# Patient Record
Sex: Female | Born: 1960
Health system: Southern US, Community
[De-identification: ages and names within clinical notes are randomized; demographics above are authoritative.]

## PROBLEM LIST (undated history)

## (undated) DIAGNOSIS — K859 Acute pancreatitis without necrosis or infection, unspecified: Secondary | ICD-10-CM

## (undated) DIAGNOSIS — I639 Cerebral infarction, unspecified: Secondary | ICD-10-CM

## (undated) HISTORY — DX: Acute pancreatitis without necrosis or infection, unspecified: K85.90

## (undated) HISTORY — DX: Cerebral infarction, unspecified: I63.9

---

## 2005-06-12 ENCOUNTER — Ambulatory Visit: Payer: Self-pay | Admitting: Family Medicine

## 2005-10-31 ENCOUNTER — Ambulatory Visit: Payer: Self-pay | Admitting: Family Medicine

## 2005-12-04 ENCOUNTER — Ambulatory Visit: Payer: Self-pay | Admitting: Family Medicine

## 2006-06-18 DIAGNOSIS — I10 Essential (primary) hypertension: Secondary | ICD-10-CM | POA: Insufficient documentation

## 2006-06-18 DIAGNOSIS — E119 Type 2 diabetes mellitus without complications: Secondary | ICD-10-CM | POA: Insufficient documentation

## 2006-09-12 ENCOUNTER — Encounter: Payer: Self-pay | Admitting: Family Medicine

## 2007-03-20 ENCOUNTER — Ambulatory Visit: Payer: Self-pay | Admitting: Family Medicine

## 2007-07-01 ENCOUNTER — Encounter: Payer: Self-pay | Admitting: Family Medicine

## 2007-07-08 ENCOUNTER — Telehealth: Payer: Self-pay | Admitting: Family Medicine

## 2007-11-14 ENCOUNTER — Telehealth (INDEPENDENT_AMBULATORY_CARE_PROVIDER_SITE_OTHER): Payer: Self-pay | Admitting: *Deleted

## 2007-12-15 ENCOUNTER — Ambulatory Visit: Payer: Self-pay | Admitting: Family Medicine

## 2007-12-15 LAB — CONVERTED CEMR LAB
Albumin/Creatinine Ratio, Urine, POC: <30
Blood Glucose, Fasting: 129 mg/dL
Creatinine, urine POC: 300 mg/dL
Hgb A1c MFr Bld: 8.2 %
Microalbumin U total vol: 80 mg/L

## 2008-04-12 ENCOUNTER — Ambulatory Visit: Payer: Self-pay | Admitting: Family Medicine

## 2008-04-12 DIAGNOSIS — N951 Menopausal and female climacteric states: Secondary | ICD-10-CM | POA: Insufficient documentation

## 2008-05-14 ENCOUNTER — Encounter: Payer: Self-pay | Admitting: Family Medicine

## 2008-07-01 ENCOUNTER — Telehealth: Payer: Self-pay | Admitting: Family Medicine

## 2008-09-01 ENCOUNTER — Ambulatory Visit: Payer: Self-pay | Admitting: Family Medicine

## 2008-10-05 ENCOUNTER — Telehealth: Payer: Self-pay | Admitting: Family Medicine

## 2008-12-30 ENCOUNTER — Encounter: Payer: Self-pay | Admitting: Family Medicine

## 2009-02-04 ENCOUNTER — Ambulatory Visit: Payer: Self-pay | Admitting: Family Medicine

## 2009-02-04 LAB — CONVERTED CEMR LAB: Creatinine,U: 200 mg/dL

## 2009-06-09 ENCOUNTER — Encounter: Payer: Self-pay | Admitting: Family Medicine

## 2009-06-13 LAB — CONVERTED CEMR LAB
AST: 20 units/L (ref 0–37)
Albumin: 4.1 g/dL (ref 3.5–5.2)
BUN: 15 mg/dL (ref 6–23)
CO2: 24 meq/L (ref 19–32)
Calcium: 9.2 mg/dL (ref 8.4–10.5)
Chloride: 104 meq/L (ref 96–112)
Folate: >20 ng/mL
HDL: 57 mg/dL (ref >39)
Iron: 48 ug/dL (ref 42–145)
Potassium: 3.5 meq/L (ref 3.5–5.3)
TSH: 1.981 microintl units/mL (ref 0.350–4.500)

## 2009-07-27 ENCOUNTER — Ambulatory Visit: Payer: Self-pay | Admitting: Family Medicine

## 2009-07-27 DIAGNOSIS — F411 Generalized anxiety disorder: Secondary | ICD-10-CM | POA: Insufficient documentation

## 2009-07-27 LAB — CONVERTED CEMR LAB
Bilirubin Urine: NEGATIVE
Nitrite: NEGATIVE
Protein, U semiquant: 30
pH: 6.5

## 2009-08-12 ENCOUNTER — Telehealth: Payer: Self-pay | Admitting: Family Medicine

## 2009-08-15 ENCOUNTER — Ambulatory Visit (HOSPITAL_COMMUNITY): Payer: Self-pay | Admitting: Psychology

## 2009-08-22 ENCOUNTER — Ambulatory Visit (HOSPITAL_COMMUNITY): Payer: Self-pay | Admitting: Psychology

## 2009-08-29 ENCOUNTER — Ambulatory Visit (HOSPITAL_COMMUNITY): Payer: Self-pay | Admitting: Psychology

## 2009-09-16 ENCOUNTER — Telehealth: Payer: Self-pay | Admitting: Family Medicine

## 2010-01-24 ENCOUNTER — Ambulatory Visit: Payer: Self-pay | Admitting: Family

## 2010-01-25 ENCOUNTER — Telehealth: Payer: Self-pay | Admitting: Family

## 2010-02-02 ENCOUNTER — Telehealth: Payer: Self-pay | Admitting: Family

## 2010-02-07 ENCOUNTER — Encounter: Payer: Self-pay | Admitting: Family Medicine

## 2010-06-12 ENCOUNTER — Encounter: Payer: Self-pay | Admitting: Family Medicine

## 2010-08-22 ENCOUNTER — Telehealth: Payer: Self-pay | Admitting: Family Medicine

## 2010-08-29 ENCOUNTER — Ambulatory Visit: Payer: Self-pay | Admitting: Family Medicine

## 2010-09-25 ENCOUNTER — Encounter: Payer: Self-pay | Admitting: Family Medicine

## 2010-10-02 ENCOUNTER — Ambulatory Visit: Admit: 2010-10-02 | Payer: Self-pay | Admitting: Family Medicine

## 2010-10-02 ENCOUNTER — Encounter: Payer: Self-pay | Admitting: Family Medicine

## 2010-10-10 NOTE — Progress Notes (Signed)
Summary: Lab orders  Phone Note Call from Patient Call back at Home Phone [phone]   Caller: Patient Call For: Nani Gasser MD Summary of Call: Pt calls and says needs to have bloodwork done and will go on Monday to [facility]. Need orders of what you want.   Initial call taken by: Kathlene November,  September 16, 2009 3:59 PM  Follow-up for Phone Call        Really just needs an A1C. Can come up here or go to lab.  Follow-up by: Nani Gasser MD,  September 16, 2009 4:08 PM  Additional Follow-up for Phone Call Additional follow up Details #1::        faxed order to lab Additional Follow-up by: Kathlene November,  September 16, 2009 4:14 PM

## 2010-10-10 NOTE — Progress Notes (Signed)
Summary: nutrition referral  Phone Note Other Incoming Call back at [phone] or [phone]   Caller: [name] @ [facility] Summary of Call: They received nutrition referral from [place]. They want to clarify if you want them to do diabetes education or general nutrition counselling.  They only do diabetes education at the [place] location.  Please advise.  [name] CMA  Feb 02, 2010 10:55 AM   Follow-up for Phone Call        diabetes education is fine. Follow-up by: [name],  Feb 02, 2010 10:57 AM  Additional Follow-up for Phone Call Additional follow up Details #1::        Advised Sabrina Dougherty of Sabrina Dougherty's order.  She states she has faxed one of their referrals to the [place] office for [facility] to sign.  I advised her Sabrina Dougherty would get it this afternoon.  [name] CMA  Feb 02, 2010 12:18 PM

## 2010-10-10 NOTE — Consult Note (Signed)
Summary: [facility]  [facility]   Imported By: Lanelle Bal 02/23/2010 11:32:44  _____________________________________________________________________  External Attachment:    Type:   Image     Comment:   External Document

## 2010-10-10 NOTE — Consult Note (Signed)
Summary: [facility]  [facility]   Imported By: Lanelle Bal 06/28/2010 11:19:46  _____________________________________________________________________  External Attachment:    Type:   Image     Comment:   External Document

## 2010-10-10 NOTE — Progress Notes (Signed)
Summary: endocrinology referral  Phone Note Call from Patient   Caller: Patient Summary of Call: Pt [facility] stating you asked her to consider endocrinology. Pt would like to referred to [facility] End[facility].  Initial call taken by: Payton Spark CMA,  Jan 25, 2010 4:11 PM  Follow-up for Phone Call        Left message for patient to return my call.  Will refer to [facility].  Will also instruct patient to increase Lantus to 55 units Subcutaneously daily.   Follow-up by: [name],  Jan 26, 2010 8:40 AM  Additional Follow-up for Phone Call Additional follow up Details #1::        Pt called back and I gave her instructions on insulin and that NP wanted to refer her. Gave her number to [facility] office in case NP had any additional info she wanted to instruct pt on. KJ LPN Additional Follow-up by: Kathlene November,  Jan 26, 2010 9:38 AM     Appended Document: endocrinology referral Spoke with pt. and verified that Malakhai Beitler wanted to make sure she had picked up glucometer. Pt states she dropped script off yesterday and will pick it up soon.

## 2010-10-10 NOTE — Assessment & Plan Note (Signed)
Summary: Sabrina on diabetes- metheney pt. - jr   Vital Signs:  Patient profile:   50 year old female Height:      63 inches Weight:      176.50 pounds BMI:     31.38 Temp:     97.7 degrees F oral Pulse rate:   85 / minute Pulse rhythm:   regular Resp:     16 per minute BP sitting:   168 / 87  (right arm) Cuff size:   large  Vitals Entered By: [name] CMA (Jan 24, 2010 1:49 PM) CC: room 6  Follow up of diabetes. Pt needs rx for glucometer,strips and lancets. Also states she never started Amlodipine due to lack of ins. but will pick up now that she has coverage.  Comments Did not start Chantix due to possible side effects.   Primary Care Provider:  Linford Arnold, C  CC:  room 6  Follow up of diabetes. Pt needs rx for glucometer and strips and lancets. Also states she never started Amlodipine due to lack of ins. but will pick up now that she has coverage. Sabrina Dougherty  History of Present Illness: Ms Sabrina Dougherty is a 50 year old female who presents today for follow up of her diabetes and hypertension.    1) DM2-Patient has not been taking cbg's.  She reports that her mail order would not fill rx of her glucometer and therefore she has not been checking her CBG's.  Reports that she is up to date on eye exam. Has never seen a podiatrist.  Reports that dietary compliance is poor.      2) HTN-  has not been using home blood BP monitor, and did not started norvasc due to lack of insurance.  She now has regained her insurance and plans to initiate Norvasc.   Allergies: 1)  ! Darvon 2)  ! Ace Inhibitors 3)  ! Augmentin  Physical Exam  General:  Overweight AA female awake, alert and in NAD Head:  Normocephalic and atraumatic without obvious abnormalities. No apparent alopecia or balding. Lungs:  Normal respiratory effort, chest expands symmetrically. Lungs are clear to auscultation, no crackles or wheezes. Heart:  Normal rate and regular rhythm. S1 and S2 normal without gallop, murmur, click, rub or  other extra sounds. Extremities:  No LE edema Psych:  Cognition and judgment appear intact. Alert and cooperative with normal attention span and concentration. No apparent delusions, illusions, hallucinations   Impression & Recommendations:  Problem # 1:  DIABETES MELLITUS II, UNCOMPLICATED (ICD-250.00) A1C is 7.7 (improved from last A1C on record 9.6 5/10).  Not yet at goal.  Patient was educated on weight loss, exercise and diabetic diet.  She is agreeable to nutrition referral and a referral for annual foot exam with podiatry.  Will increase Lantus to 55 units Subcutaneously daily.  Patient instructed to monitor CBG's, record and bring this record to her next appointment in 1 month. Her updated medication list for this problem includes:    Lantus Soln (Insulin glargine soln) ..... Inject 50 units daily    Metformin Hcl 1000 Mg Tabs (Metformin hcl) .Sabrina Dougherty... Take 1 tablet by mouth two times a day  Orders: T-Comprehensive Metabolic Panel [phone]) Fingerstick (36416) Hemoglobin A1C ([address]) Podiatry Referral (Podiatry) Nutrition Referral (Nutrition)  Labs Reviewed: Creat: 0.82 (06/09/2009)   Microalbumin: 80 (02/04/2009)  Last Eye Exam: normal (05/14/2008) Reviewed HgBA1c results: 7.7 (01/24/2010)  9.6 (02/04/2009)  Problem # 2:  HYPERTENSION, BENIGN SYSTEMIC (ICD-401.1) Assessment: Deteriorated Patient is not  yet taking amlodipine,  I instructed her to initiate due to uncontrolled HTN and maintain a low sodium diet.    Her updated medication list for this problem includes:    Hydrochlorothiazide 25 Mg Tabs (Hydrochlorothiazide) ..... Once daily by mouth    Metoprolol Tartrate 100 Mg Tabs (Metoprolol tartrate) .Sabrina Dougherty... 2 tabs two times a day po    Amlodipine Besylate 10 Mg Tabs (Amlodipine besylate) .Sabrina Dougherty... Take 1 tablet by mouth once a day  Orders: T-Comprehensive Metabolic Panel ([id number])  BP today: 168/87 Prior BP: 151/89 (07/27/2009)  Prior 10 Yr Risk Heart Disease: 8 %  (07/27/2009)  Labs Reviewed: K+: 3.5 (06/09/2009) Creat: : 0.82 (06/09/2009)   Chol: 142 (06/09/2009)   HDL: 57 (06/09/2009)   LDL: 62 (06/09/2009)   TG: 117 (06/09/2009)  Complete Medication List: 1)  Hydrochlorothiazide 25 Mg Tabs (Hydrochlorothiazide) .... Once daily by mouth 2)  Lantus Soln (Insulin glargine soln) .... Inject 50 units daily 3)  Metoprolol Tartrate 100 Mg Tabs (Metoprolol tartrate) .... 2 tabs two times a day po 4)  Metformin Hcl 1000 Mg Tabs (Metformin hcl) .... Take 1 tablet by mouth two times a day 5)  Amlodipine Besylate 10 Mg Tabs (Amlodipine besylate) .... Take 1 tablet by mouth once a day 6)  Glucometer, Lancets, Strips, Ultra Fine Syringes (100unit)  .... Dx: 250.00 on insulin to test two times a day 7)  Chantix Starting Month Pak 0.5 Mg X 11 & 1 Mg X 42 Tabs (Varenicline tartrate) .... Take as directed. 8)  Omega-3 Cf 1000 Mg Caps (Omega-3 fatty acids) .... Take 1 capsule by mouth once a day  Patient Instructions: 1)  You will called about your referral to Nutrition and Podiatry. 2)  Work hard on diet, exercise and weight loss. 3)  Check sugars twice daily.  Goal sugars are fasting 80-110 and after meals, no greater than 150.   4)  Please follow up in 1 month for a complete physical. Prescriptions: HYDROCHLOROTHIAZIDE 25 MG TABS (HYDROCHLOROTHIAZIDE) once daily by mouth  #30 x 2   Entered and Authorized by:   [name]   Signed by:   [name] on 01/24/2010   Method used:   Electronically to        [facility] (retail)       [address]       [place], [place]  [address]       Ph: [phone]       Fax: [phone]   RxID:   [id number] GLUCOMETER, LANCETS, STRIPS, ULTRA FINE SYRINGES (100UNIT) Dx: 250.00 On insulin to test two times a day  #50month x 2   Entered and Authorized by:   [name]   Signed by:   [name] on 01/24/2010   Method used:   Print then Give to Patient   RxID:    [id number]   Current Allergies (reviewed today): ! DARVON ! ACE INHIBITORS ! AUGMENTIN  Laboratory Results   Blood Tests     HGBA1C: 7.7%   (Normal Range: Non-Diabetic - 3-6%   Control Diabetic - 6-8%)

## 2010-10-12 NOTE — Assessment & Plan Note (Signed)
Summary: CPE w/ pap   Vital Signs:  Patient profile:   50 year old female Height:      63 inches Weight:      181 pounds BMI:     32.18 O2 Sat:      95 % on Room air Pulse rate:   83 / minute BP sitting:   182 / 98  (left arm) Cuff size:   large  Vitals Entered By: Payton Spark CMA (September 05, 2010 10:34 AM)  O2 Flow:  Room air CC: CPE w/ pap   Primary Care Provider:  Linford Arnold, C  CC:  CPE w/ pap.  History of Present Illness: Says she sees an endocrinologist for her diabetes.  She says she gets regular lab work and has checked her urine as well.  She is pretty sure he is checked her cholesterol as well.  She says that her blood pressure is high this morning because she has been up since 1 a.m. with her daughter who has been sick.  Her daughter has special needs.  She says she did take her medication this morning.  She has not been here in every year.  Current Medications (verified): 1)  Hydrochlorothiazide 25 Mg Tabs (Hydrochlorothiazide) .... Once Daily By Mouth 2)  Lantus  Soln (Insulin Glargine Soln) .... Inject 50 Units Daily 3)  Metoprolol Tartrate 100 Mg  Tabs (Metoprolol Tartrate) .... 2 Tabs Two Times A Day Po 4)  Metformin Hcl 1000 Mg  Tabs (Metformin Hcl) .... Take 1 Tablet By Mouth Two Times A Day 5)  Glucometer, Lancets, Strips, Ultra Fine Syringes (100unit) .... Dx: 250.00 On Insulin To Test Two Times A Day 6)  Omega-3 Cf 1000 Mg Caps (Omega-3 Fatty Acids) .... Take 1 Capsule By Mouth Once A Day  Allergies (verified): 1)  ! Darvon 2)  ! Ace Inhibitors 3)  ! Augmentin  Past History:  Past Surgical History: Last updated: 06/18/2006 Cholecystectomy  1996, Tonsillectomy  1966, Tubal ligation  1989  Family History: Last updated: 09/12/2006 Mother - Alcoholism  Sister-DM  Uncle-MI, stroke, DM  Social History: Last updated: 07/27/2009 Married to [employer].  3 children, daughter Val Eagle  is disabled.  Started smoking again.   3 EtOH per week,  no drugs, 1 caffeinated drink per day.  No regular exercise.  Past Medical History: G6 P3 A3 cholecystectomy 1996 Tubal  ligation 1989 Tonsillectomy 1966 Cataracts OU See endocrinology for her Diabetes.   Physical Exam  General:  Well-developed,well-nourished,in no acute distress; alert,appropriate and cooperative throughout examination Head:  Normocephalic and atraumatic without obvious abnormalities. No apparent alopecia or balding. Eyes:  No corneal or conjunctival inflammation noted. EOMI. Perrla. Ears:  External ear exam shows no significant lesions or deformities.  Otoscopic examination reveals clear canals, tympanic membranes are intact bilaterally without bulging, retraction, inflammation or discharge. Hearing is grossly normal bilaterally. Nose:  External nasal examination shows no deformity or inflammation.  Mouth:  Oral mucosa and oropharynx without lesions or exudates.  Teeth in good repair. Neck:  No deformities, masses, or tenderness noted. Chest Wall:  No deformities, masses, or tenderness noted. Breasts:  No mass, nodules, thickening, tenderness, bulging, retraction, inflamation, nipple discharge or skin changes noted.   Lungs:  Normal respiratory effort, chest expands symmetrically. Lungs are clear to auscultation, no crackles or wheezes. Heart:  Normal rate and regular rhythm. S1 and S2 normal without gallop, murmur, click, rub or other extra sounds. Abdomen:  Bowel sounds positive,abdomen soft and non-tender without masses,  organomegaly or hernias noted. Genitalia:  Normal introitus for age, no external lesions, no vaginal discharge, mucosa pink and moist, no vaginal or cervical lesions, no vaginal atrophy, no friaility or hemorrhage, normal uterus size and position, no adnexal masses or tenderness Msk:  No deformity or scoliosis noted of thoracic or lumbar spine.   Pulses:  R and L carotid,radial,dorsalis pedis and posterior tibial pulses are full and equal  bilaterally Extremities:  No clubbing, cyanosis, edema, or deformity noted with normal full range of motion of all joints.   Neurologic:  No cranial nerve deficits noted. Station and gait are normal.  Sensory, motor and coordinative functions appear intact. Skin:  no rashes.   Cervical Nodes:  No lymphadenopathy noted Axillary Nodes:  No palpable lymphadenopathy Psych:  Cognition and judgment appear intact. Alert and cooperative with normal attention span and concentration. No apparent delusions, illusions, hallucinations   Impression & Recommendations:  Problem # 1:  HEALTH MAINTENANCE EXAM (ICD-V70.0)  Exam is normal today except for being obese. Encourage regular exercise and healthy diet She follow with Endocrine for her DM.  She is due for colonsocoyp since she is now 50 yo. She declined flu vac and Tdap today.  she is also due for mammogram and says she's had all of his discomfort in her left breast lately for radiating from her shoulders I do recommend that she get his updated. Orders: T-Mammography Bilateral Screening (03474)  Complete Medication List: 1)  Hydrochlorothiazide 25 Mg Tabs (Hydrochlorothiazide) .... Once daily by mouth 2)  Lantus Soln (Insulin glargine soln) .... Inject 50 units daily 3)  Metoprolol Tartrate 100 Mg Tabs (Metoprolol tartrate) .... 2 tabs two times a day po 4)  Metformin Hcl 1000 Mg Tabs (Metformin hcl) .... Take 1 tablet by mouth two times a day 5)  Glucometer, Lancets, Strips, Ultra Fine Syringes (100unit)  .... Dx: 250.00 on insulin to test two times a day 6)  Omega-3 Cf 1000 Mg Caps (Omega-3 fatty acids) .... Take 1 capsule by mouth once a day  Other Orders: Gastroenterology Referral (GI)  Contraindications/Deferment of Procedures/Staging:    Test/Procedure: FLU VAX    Reason for deferment: patient declined     Test/Procedure: TD vaccine    Reason for deferment: declined   Patient Instructions: 1)  It is important that you exercise  reguarly at least 20 minutes 5 times a week. If you develop chest pain, have severe difficulty breathing, or feel very tired, stop exercising immediately and seek medical attention.  2)  You need to lose weight. Consider a lower calorie diet. 3)  Since you are now 49 you are due for a screening colonoscopy to screen for colon Cancer We will make a referral for you for this.  Prescriptions: METFORMIN HCL 1000 MG  TABS (METFORMIN HCL) Take 1 tablet by mouth two times a day  #180 x 3   Entered and Authorized by:   Nani Gasser MD   Signed by:   Nani Gasser MD on 09/05/2010   Method used:   Printed then faxed to ...       [facility] (retail)       [address]       [place], [place]  [address]       Ph: [phone]       Fax: [phone]   RxID:   [id number] METOPROLOL TARTRATE 100 MG  TABS (METOPROLOL TARTRATE) 2 tabs two times a day po  #180 x 3  Entered and Authorized by:   Nani Gasser MD   Signed by:   Nani Gasser MD on 09/05/2010   Method used:   Printed then faxed to ...       [facility]  [address] (retail)       [address]       [place], [place]  [address]       Ph: [phone]       Fax: [phone]   RxID:   [id number] HYDROCHLOROTHIAZIDE 25 MG TABS (HYDROCHLOROTHIAZIDE) once daily by mouth  #90 x 3   Entered and Authorized by:   Nani Gasser MD   Signed by:   Nani Gasser MD on 09/05/2010   Method used:   Printed then faxed to ...       [facility]  [address] (retail)       [address]       [place], [place]  [address]       Ph: [phone]       Fax: [phone]   RxID:   [id number]    Orders Added: 1)  Gastroenterology Referral [GI] 2)  Est. Patient age 44-64 [53] 3)  T-Mammography Bilateral Screening [[id number]]  Appended Document: CPE w/ pap Call pt: Pap smear normal - to return for  PAP in two years January  3, 20124:40 PM Linford Arnold MD, [facility]   10:44 AM September 13, 2010 [place] CMA, Duncan Dull),  Sue Lush left message on vm with above results

## 2010-10-12 NOTE — Progress Notes (Signed)
  Phone Note Call from Patient   Caller: Patient Call For: Nani Gasser MD Summary of Call: pt called and states she needs a cancer screening and refills of her insulin Initial call taken by: Avon Gully CMA, Duncan Dull),  August 22, 2010 4:35 PM  Follow-up for Phone Call        called pt and advisd her that she needs a CPE per last ov note.scheduled  cpe and will [facility] lantus [facility] [place] Follow-up by: Avon Gully CMA, Duncan Dull),  August 22, 2010 4:35 PM    Prescriptions: LANTUS  SOLN (INSULIN GLARGINE SOLN) inject 50 units daily  #30 x 0   Entered by:   Avon Gully CMA, (AAMA)   Authorized by:   Nani Gasser MD   Signed by:   Avon Gully CMA, (AAMA) on 08/22/2010   Method used:   Printed then faxed to ...       [facility]  [address] (retail)       [address]       [place], [place]  [address]       Ph: [phone]       Fax: [phone]   RxID:   [id number]

## 2010-11-01 NOTE — Letter (Signed)
Summary: [facility]   Imported By: Kassie Mends 10/25/2010 11:37:24  _____________________________________________________________________  External Attachment:    Type:   Image     Comment:   External Document

## 2011-08-14 ENCOUNTER — Encounter: Payer: Self-pay | Admitting: Family Medicine

## 2011-08-14 ENCOUNTER — Ambulatory Visit (INDEPENDENT_AMBULATORY_CARE_PROVIDER_SITE_OTHER): Payer: Self-pay | Admitting: Family Medicine

## 2011-08-14 ENCOUNTER — Other Ambulatory Visit (HOSPITAL_COMMUNITY)
Admission: RE | Admit: 2011-08-14 | Discharge: 2011-08-14 | Disposition: A | Discharge status: Home / Self Care | Payer: Self-pay | Source: Ambulatory Visit | Attending: Family Medicine | Admitting: Family Medicine

## 2011-08-14 VITALS — BP 146/81 | HR 56 | Wt 180.0 lb

## 2011-08-14 DIAGNOSIS — Z01419 Encounter for gynecological examination (general) (routine) without abnormal findings: Secondary | ICD-10-CM

## 2011-08-14 DIAGNOSIS — Z1231 Encounter for screening mammogram for malignant neoplasm of breast: Secondary | ICD-10-CM

## 2011-08-14 DIAGNOSIS — Z23 Encounter for immunization: Secondary | ICD-10-CM

## 2011-08-14 DIAGNOSIS — E119 Type 2 diabetes mellitus without complications: Secondary | ICD-10-CM

## 2011-08-14 DIAGNOSIS — Z1159 Encounter for screening for other viral diseases: Secondary | ICD-10-CM | POA: Insufficient documentation

## 2011-08-14 NOTE — Patient Instructions (Signed)
Start a regular exercise program and make sure you are eating a healthy diet Try to eat 4 servings of dairy a day or take a calcium supplement (500mg twice a day). Your vaccines are up to date.   

## 2011-08-14 NOTE — Progress Notes (Signed)
  Subjective:     Sabrina Dougherty is a 50 y.o. female and is here for a comprehensive physical exam. The patient reports no problems.  History   Social History  . Marital Status: Married    Spouse Name: N/A    Number of Children: N/A  . Years of Education: N/A   Occupational History  . Not on file.   Social History Main Topics  . Smoking status: Current Everyday Smoker  . Smokeless tobacco: Not on file  . Alcohol Use: 1.5 oz/week    3 drink(s) per week  . Drug Use: No  . Sexually Active: Yes   Other Topics Concern  . Not on file   Social History Narrative  . No narrative on file   Health Maintenance  Topic Date Due  . Tetanus/tdap  09/12/2008  . Mammogram  10/17/2009  . Colonoscopy  10/17/2009  . Influenza Vaccine  06/10/2012  . Pap Smear  08/13/2014    The following portions of the patient's history were reviewed and updated as appropriate: allergies, current medications, past family history, past medical history, past social history, past surgical history and problem list.  Review of Systems A comprehensive review of systems was negative.   Objective:    BP 146/81  Pulse 56  Wt 180 lb (81.647 kg)  LMP 07/10/2011 General appearance: alert, cooperative and appears stated age Head: Normocephalic, without obvious abnormality, atraumatic Eyes: conj clear, EOMi. PEERLA Ears: normal TM's and external ear canals both ears Nose: Nares normal. Septum midline. Mucosa normal. No drainage or sinus tenderness. Throat: lips, mucosa, and tongue normal; teeth and gums normal Neck: no adenopathy, no carotid bruit, no JVD, supple, symmetrical, trachea midline and thyroid not enlarged, symmetric, no tenderness/mass/nodules Back: symmetric, no curvature. ROM normal. No CVA tenderness. Lungs: clear to auscultation bilaterally Breasts: normal appearance, no masses or tenderness Heart: regular rate and rhythm, S1, S2 normal, no murmur, click, rub or gallop Abdomen: soft,  non-tender; bowel sounds normal; no masses,  no organomegaly Pelvic: cervix normal in appearance, external genitalia normal, no adnexal masses or tenderness, no cervical motion tenderness, rectovaginal septum normal, uterus normal size, shape, and consistency and vagina normal without discharge Extremities: extremities normal, atraumatic, no cyanosis or edema Pulses: 2+ and symmetric Skin: Skin color, texture, turgor normal. No rashes or lesions Lymph nodes: Cervical, supraclavicular, and axillary nodes normal. Neurologic: Grossly normal    Assessment:    Healthy female exam.      Plan:     See After Visit Summary for Counseling Recommendations  Start a regular exercise program and make sure you are eating a healthy diet Try to eat 4 servings of dairy a day or take a calcium supplement (500mg  twice a day). Your vaccines are up to date.  Flu vaccine given.  She wants to hold on on tetanus vaccine today since getting flu shot Will sched for mamo.

## 2011-08-16 ENCOUNTER — Ambulatory Visit: Payer: Self-pay

## 2011-09-25 ENCOUNTER — Encounter: Payer: Self-pay | Admitting: Physician Assistant

## 2011-09-25 ENCOUNTER — Ambulatory Visit (INDEPENDENT_AMBULATORY_CARE_PROVIDER_SITE_OTHER): Payer: Self-pay | Admitting: Physician Assistant

## 2011-09-25 VITALS — BP 158/88 | HR 71 | Wt 178.0 lb

## 2011-09-25 DIAGNOSIS — L0100 Impetigo, unspecified: Secondary | ICD-10-CM

## 2011-09-25 DIAGNOSIS — J302 Other seasonal allergic rhinitis: Secondary | ICD-10-CM

## 2011-09-25 DIAGNOSIS — I10 Essential (primary) hypertension: Secondary | ICD-10-CM

## 2011-09-25 DIAGNOSIS — J309 Allergic rhinitis, unspecified: Secondary | ICD-10-CM

## 2011-09-25 MED ORDER — MUPIROCIN 2 % EX OINT: TOPICAL_OINTMENT | Freq: Three times a day (TID) | CUTANEOUS | Status: AC

## 2011-09-25 NOTE — Progress Notes (Signed)
  Subjective:    Patient ID: Sabrina Dougherty, female    DOB: 08-Oct-1960, 51 y.o.   MRN: [id number]  HPI Patient noticed what looked like a bug bite about 1 1/2 weeks ago. She noticed clear drainage and that it itched a lot. It gradually started to get bigger with more discharge. She used lots of home remedies that she didn't know exactly what was in them along with hydrogen peroxide, bleach, and OTC ointment. They caused the bump to scab over but it has not gone away. She reports it still itches and there is some pain. She denies fever, chills, nausea, or vomiting.   Patient does not check her blood pressures at home; however, today she has not taken her blood pressure medicine yesterday and did not take today's dose until 30 minutes ago. She denies headache or feeling bad.   She feels "stopped up". Her ears have been popping lately. She denies sinus pressure or sore throat. She has not had any SOB or wheezing.   Review of Systems     Objective:   Physical Exam  Constitutional: She is oriented to person, place, and time. She appears well-developed and well-nourished.  HENT:  Head: Normocephalic and atraumatic.  Right Ear: External ear normal.  Left Ear: External ear normal.  Mouth/Throat: Oropharynx is clear and moist. No oropharyngeal exudate.       No maxillary tenderness. Bilateral edematous turbinates with clear rhinorrhea.   Eyes: Right eye exhibits no discharge. Left eye exhibits no discharge.  Neck: Normal range of motion. Neck supple.  Cardiovascular: Normal rate, regular rhythm and normal heart sounds.   Pulmonary/Chest: Effort normal and breath sounds normal.  Lymphadenopathy:    She has no cervical adenopathy.  Neurological: She is alert and oriented to person, place, and time.  Skin:       1 3/4 cm by 2cm plaque that is scabbed over with some honey crusting lining the outside.   Psychiatric: She has a normal mood and affect. Her behavior is normal.            Assessment & Plan:  Impetigo- Bactroban prescribed. Symptomatic care given in handout form. Instructed patient to stop all other OTC care.   Allergic rhinitis- Samples given of Veramyst. Symptomatic care given.  Hypertension-Recheck blood pressure after I took history and it did decrease. Instructed patient to work at remembering to take bp meds everyday for 2 weeks and then schedule a nurse visit for a blood pressure recheck.

## 2011-09-25 NOTE — Patient Instructions (Addendum)
Start antibiotic cream. Soak daily in hibacleans with warm water to get the scab off. Stop using any OTC oinments. Start Veramyst for allergies. Make an appointment for nurse visit to check blood pressure.  Impetigo Impetigo is an infection of the skin, most common in babies and children.  CAUSES  It is caused by staphylococcal or streptococcal germs (bacteria). Impetigo can start after any damage to the skin. The damage to the skin may be from things like:   Chickenpox.   Scrapes.   Scratches.   Insect bites (common when children scratch the bite).   Cuts.   Nail biting or chewing.  Impetigo is contagious. It can be spread from one person to another. Avoid close skin contact, or sharing towels or clothing. SYMPTOMS  Impetigo usually starts out as small blisters or pustules. Then they turn into tiny yellow-crusted sores (lesions).  There may also be:  Large blisters.   Itching or pain.   Pus.   Swollen lymph glands.  With scratching, irritation, or non-treatment, these small areas may get larger. Scratching can cause the germs to get under the fingernails; then scratching another part of the skin can cause the infection to be spread there. DIAGNOSIS  Diagnosis of impetigo is usually made by a physical exam. A skin culture (test to grow bacteria) may be done to prove the diagnosis or to help decide the best treatment.  TREATMENT  Mild impetigo can be treated with prescription antibiotic cream. Oral antibiotic medicine may be used in more severe cases. Medicines for itching may be used. HOME CARE INSTRUCTIONS   To avoid spreading impetigo to other body areas:   Keep fingernails short and clean.   Avoid scratching.   Cover infected areas if necessary to keep from scratching.   Gently wash the infected areas with antibiotic soap and water.   Soak crusted areas in warm soapy water using antibiotic soap.   Gently rub the areas to remove crusts. Do not scrub.   Wash hands  often to avoid spread this infection.   Keep children with impetigo home from school or daycare until they have used an antibiotic cream for 48 hours (2 days) or oral antibiotic medicine for 24 hours (1 day), and their skin shows significant improvement.   Children may attend school or daycare if they only have a few sores and if the sores can be covered by a bandage or clothing.  SEEK MEDICAL CARE IF:   More blisters or sores show up despite treatment.   Other family members get sores.   Rash is not improving after 48 hours (2 days) of treatment.  SEEK IMMEDIATE MEDICAL CARE IF:   You see spreading redness or swelling of the skin around the sores.   You see red streaks coming from the sores.   Your child develops a fever of 100.4 F (37.2 C) or higher.   Your child develops a sore throat.   Your child is acting ill (lethargic, sick to their stomach).  Document Released: 08/24/2000 Document Revised: 05/09/2011 Document Reviewed: 06/23/2008 [facility] Patient Information 2012 [place], [place].

## 2011-10-05 ENCOUNTER — Telehealth: Payer: Self-pay | Admitting: *Deleted

## 2011-10-05 MED ORDER — AZITHROMYCIN 500 MG PO TABS: 1000.0000 mg | ORAL_TABLET | Freq: Once | ORAL | Status: AC

## 2011-10-05 NOTE — Telephone Encounter (Signed)
Rx sent. Call if not better in one week or if having symptoms.

## 2011-10-05 NOTE — Telephone Encounter (Signed)
Exposed to bacterial infection that husband has- non gonoccial urethritis.- [facility] Annalia wants to be treated as well. [facility] on [address] in [place]. He was given azithromycin and took 2 pills then done.

## 2011-10-09 ENCOUNTER — Telehealth: Payer: Self-pay | Admitting: *Deleted

## 2011-10-09 ENCOUNTER — Other Ambulatory Visit: Payer: Self-pay | Admitting: Family Medicine

## 2011-10-16 ENCOUNTER — Ambulatory Visit
Admission: RE | Admit: 2011-10-16 | Discharge: 2011-10-16 | Disposition: A | Discharge status: Home / Self Care | Payer: 59 | Source: Ambulatory Visit | Attending: Family Medicine | Admitting: Family Medicine

## 2011-10-16 DIAGNOSIS — Z1231 Encounter for screening mammogram for malignant neoplasm of breast: Secondary | ICD-10-CM

## 2011-10-24 ENCOUNTER — Other Ambulatory Visit: Payer: Self-pay | Admitting: Family Medicine

## 2011-10-24 DIAGNOSIS — R928 Other abnormal and inconclusive findings on diagnostic imaging of breast: Secondary | ICD-10-CM

## 2011-11-02 ENCOUNTER — Encounter: Payer: Self-pay | Admitting: *Deleted

## 2011-11-08 ENCOUNTER — Other Ambulatory Visit: Payer: Self-pay | Admitting: *Deleted

## 2011-11-08 MED ORDER — AMBULATORY NON FORMULARY MEDICATION: Status: DC

## 2011-11-09 ENCOUNTER — Ambulatory Visit
Admission: RE | Admit: 2011-11-09 | Discharge: 2011-11-09 | Disposition: A | Discharge status: Home / Self Care | Payer: 59 | Source: Ambulatory Visit | Attending: Family Medicine | Admitting: Family Medicine

## 2011-11-09 DIAGNOSIS — R928 Other abnormal and inconclusive findings on diagnostic imaging of breast: Secondary | ICD-10-CM

## 2012-02-09 DIAGNOSIS — I639 Cerebral infarction, unspecified: Secondary | ICD-10-CM

## 2012-02-09 HISTORY — DX: Cerebral infarction, unspecified: I63.9

## 2012-02-26 ENCOUNTER — Ambulatory Visit (INDEPENDENT_AMBULATORY_CARE_PROVIDER_SITE_OTHER): Payer: 59 | Admitting: Family Medicine

## 2012-02-26 ENCOUNTER — Encounter: Payer: Self-pay | Admitting: Family Medicine

## 2012-02-26 VITALS — BP 191/80 | HR 62 | Wt 178.0 lb

## 2012-02-26 DIAGNOSIS — E119 Type 2 diabetes mellitus without complications: Secondary | ICD-10-CM

## 2012-02-26 DIAGNOSIS — F17200 Nicotine dependence, unspecified, uncomplicated: Secondary | ICD-10-CM

## 2012-02-26 DIAGNOSIS — I639 Cerebral infarction, unspecified: Secondary | ICD-10-CM | POA: Insufficient documentation

## 2012-02-26 DIAGNOSIS — I10 Essential (primary) hypertension: Secondary | ICD-10-CM

## 2012-02-26 DIAGNOSIS — G459 Transient cerebral ischemic attack, unspecified: Secondary | ICD-10-CM

## 2012-02-26 MED ORDER — OLMESARTAN-AMLODIPINE-HCTZ 40-5-25 MG PO TABS: 1.0000 | ORAL_TABLET | Freq: Every day | ORAL | Status: DC

## 2012-02-26 MED ORDER — PRAVASTATIN SODIUM 40 MG PO TABS: 40.0000 mg | ORAL_TABLET | Freq: Every day | ORAL | Status: DC

## 2012-02-26 NOTE — Patient Instructions (Signed)

## 2012-02-26 NOTE — Progress Notes (Addendum)
Subjective:    Patient ID: Sabrina Dougherty, female    DOB: Feb 05, 1961, 51 y.o.   MRN: [id number]  HPI  Here today for hospital followup. Went to [facility] about 2 weeks ago.  Thought maybe had a mini-stroke bc speech was slurred and felt numbness in her right fingers.  Says BP was high. They did start her ASA.  She was supposed to start a new. She doesn't remember what reaction she had to ACEi. She says it was a really long time ago.  She says just doesn't feel right since she has been home.  Last eye exam was in January.  She says the numbness in her right hand has resolved. No residual weakness.      DM- She says she ate what she wanted at the hospital so her sugars went up for awhile.   Lab Results  Component Value Date   HGBA1C 7.7 01/24/2010      Review of Systems     Objective:   Physical Exam  Constitutional: She is oriented to person, place, and time. She appears well-developed and well-nourished.  HENT:  Head: Normocephalic and atraumatic.  Cardiovascular: Normal rate, regular rhythm and normal heart sounds.   Pulmonary/Chest: Effort normal and breath sounds normal.  Neurological: She is alert and oriented to person, place, and time. She displays normal reflexes. No cranial nerve deficit. Coordination normal.       Strength is 5/5 in UE and LE.    Skin: Skin is warm and dry.  Psychiatric: She has a normal mood and affect. Her behavior is normal.          Assessment & Plan:  HTN- Uncontrolled. Will start Tribenzor 40/5/25. Continue metoprolol and d/c hctz.    F/U in 2 weeks. Discussed low salt diet and regular exercise and weight loss.  Stroke  - discussed the importance of control her blood pressure and cholesterol, diabetic control, and ASA. We'll start pravastatin 40 mg daily. I gave her samples of Tribenzor 40/5/25. We are not clear exactly what reaction she had ACE inhibitor. She says it was years ago and doesn't really remember. I told her to stop  immediately if she noticed any rash, shortness of breath, swelling of the tongue or lips. If that happens she is to stop it immediately, call our office and take a Benadryl. If she feels short of breath she is to call 911. We are just not sure if her reaction was mild such as a cough or truly an allergic reaction, as she says she really doesn't remember.  DM- Glucose was 105 today.  Work on getting back into diet and walking for exercise. She has had a hard time losing weight.  Check TSH. [name] recently got a dog to help motivated her to exercise. She has been walking more but says struggling to lose weight  Lab Results  Component Value Date   HGBA1C 7.7 01/24/2010   Addendum: I received notes from her hospital admission to [facility]. Her final diagnosis was slurred speech an acute left temporal infarct. She was admitted on June 1 for slurred speech and numbness in her tongue and the tip of her fingers. She was admitted to rule out CVA. She did have a CT of the head which was negative. MRI revealed acute left temporal infarct. She did have a 2-D echo which showed mild left ventricular hypertrophy with a negative bubble study. She was also noted to have right carotid stenosis 60-75%  of the left and 139% stenosis on the right. Transcranial Dopplers showed increased velocity of the right ACA which is consistent with right carotid stenosis. Her speech improved throughout her hospital admission. Neurology was consulted. She was started on aspirin and a statin daily. Then as noted above she never filled the prescription for the statin. She is supposed to follow with neurology. I will call her and make sure that she has a followup appointment.

## 2012-02-28 ENCOUNTER — Encounter: Payer: Self-pay | Admitting: Family Medicine

## 2012-02-28 DIAGNOSIS — F17200 Nicotine dependence, unspecified, uncomplicated: Secondary | ICD-10-CM | POA: Insufficient documentation

## 2012-03-20 ENCOUNTER — Encounter: Payer: Self-pay | Admitting: Family Medicine

## 2012-03-20 ENCOUNTER — Ambulatory Visit (INDEPENDENT_AMBULATORY_CARE_PROVIDER_SITE_OTHER): Payer: 59 | Admitting: Family Medicine

## 2012-03-20 VITALS — BP 119/79 | HR 70 | Ht 63.0 in | Wt 176.0 lb

## 2012-03-20 DIAGNOSIS — E119 Type 2 diabetes mellitus without complications: Secondary | ICD-10-CM

## 2012-03-20 DIAGNOSIS — I10 Essential (primary) hypertension: Secondary | ICD-10-CM

## 2012-03-20 DIAGNOSIS — E663 Overweight: Secondary | ICD-10-CM

## 2012-03-20 DIAGNOSIS — Z23 Encounter for immunization: Secondary | ICD-10-CM

## 2012-03-20 LAB — POCT GLYCOSYLATED HEMOGLOBIN (HGB A1C): Hemoglobin A1C: 9

## 2012-03-20 MED ORDER — OLMESARTAN-AMLODIPINE-HCTZ 40-5-25 MG PO TABS: 1.0000 | ORAL_TABLET | Freq: Every day | ORAL | Status: DC

## 2012-03-20 MED ORDER — LINAGLIPTIN-METFORMIN HCL 2.5-1000 MG PO TABS: 1.0000 | ORAL_TABLET | Freq: Two times a day (BID) | ORAL | Status: DC

## 2012-03-20 NOTE — Patient Instructions (Addendum)
Continue to work on diet and exercise

## 2012-03-20 NOTE — Progress Notes (Signed)
  Subjective:    Patient ID: Sabrina Dougherty, female    DOB: [birth year], 51 y.o.   MRN: [id number]  HPI HTN - Says doing well on the tribenzor. Says gets sleeps more frequently.  Says not as hungray. Noticed has worse gas. No other side effects. No chest pain or short of breath or dizziness.  DM - Says new glucometer is broken.  No lows, but she is unable to check her meter. No cuts or sores on her feet that are not healing well. She says she's taking her medication regularly.Sabrina Dougherty has been better about her diet the last few weeks.  No regular exercise.  Says can't afford a new machine right now.     Review of Systems     Objective:   Physical Exam  Constitutional: She is oriented to person, place, and time. She appears well-developed and well-nourished.  HENT:  Head: Normocephalic and atraumatic.  Cardiovascular: Normal rate, regular rhythm and normal heart sounds.   Pulmonary/Chest: Effort normal and breath sounds normal.  Neurological: She is alert and oriented to person, place, and time.  Skin: Skin is warm and dry.  Psychiatric: She has a normal mood and affect. Her behavior is normal.          Assessment & Plan:  HTN - Looks fantastic.  On the Tribenzor and metoprolol.  She is feeling well.   Reassured her that this will help reduce her risk of stroke.  Hx of recent TIA. Fatigue may get better in a couple of months.  If not then let me know. Continue to work on low-salt diet and reducing her stress.  DM- A1C is 9.0. Uncontrolled. She plans on joining weight watchers on Monday.  I think this is a fantastic idea. I will also change her to [facility].  Followup in 6 weeks to make sure that she's getting on track and losing weight and that she's not having a low events. Unfortunately she says she will not be able to afford a new glucose machine. We'll see if we have any samples to give her at her followup visit. She will be due for urine microalbumin and foot exam at her next  followup visit.  Overweight - continue to work on weight loss. She plans on joining [employer] on Monday. I strongly encouraged her to a regular exercise which is not doing well. I think she has made some good changes in her diet which is fantastic.

## 2012-03-25 ENCOUNTER — Telehealth: Payer: Self-pay | Admitting: *Deleted

## 2012-03-25 NOTE — Telephone Encounter (Signed)
Pt notified of MD instrucrtions- will try this and let us know if does not work. KG LPN

## 2012-03-25 NOTE — Telephone Encounter (Signed)
Pt calls and states that the BP med is causing her to sleep 11 hours a day and she can not do this, states" there is more to my life than sleep".Wants to know what she can do about this. Also said the "other med" you gave her is expensive as well. Please advise

## 2012-03-25 NOTE — Telephone Encounter (Signed)
The Tribenzor should be $25 a month with the coupon card, which is actually good price because it's actually 3 pills and 1. As far as the metoprolol is concerned, she can try cutting it in half and taking it at bedtime. This may help with the sleepiness. Try this for one week and if she still feels she sleeping too much then please call me and let me know.

## 2012-04-04 ENCOUNTER — Ambulatory Visit (INDEPENDENT_AMBULATORY_CARE_PROVIDER_SITE_OTHER): Payer: 59 | Admitting: Family Medicine

## 2012-04-04 VITALS — BP 122/72 | HR 71 | Wt 176.0 lb

## 2012-04-04 DIAGNOSIS — I10 Essential (primary) hypertension: Secondary | ICD-10-CM

## 2012-04-04 NOTE — Progress Notes (Addendum)
  Subjective:    Patient ID: Sabrina Dougherty, female    DOB: Dec 06, 1960, 51 y.o.   MRN: [id number] BP check. Pt brought forms to be filled out. Mail to pt when complete  Pt denies chest pain, SOB, dizziness, or heart palpitations.  Taking meds as directed w/o problems.  Denies medication side effects.  5 min spent with pt.  HPI    Review of Systems     Objective:   Physical Exam        Assessment & Plan:  HTN- Looks fantastic!!!.  I need to see her back on 06/20/12 for diabetic follow up.   Nani Gasser, MD

## 2012-04-25 ENCOUNTER — Other Ambulatory Visit: Payer: Self-pay | Admitting: *Deleted

## 2012-04-25 MED ORDER — METOPROLOL SUCCINATE ER 100 MG PO TB24: 100.0000 mg | ORAL_TABLET | Freq: Every day | ORAL | Status: DC

## 2012-07-04 ENCOUNTER — Other Ambulatory Visit: Payer: Self-pay | Admitting: Family Medicine

## 2012-07-04 DIAGNOSIS — R921 Mammographic calcification found on diagnostic imaging of breast: Secondary | ICD-10-CM

## 2012-10-15 ENCOUNTER — Encounter: Payer: Self-pay | Admitting: Family Medicine

## 2012-10-15 ENCOUNTER — Ambulatory Visit (INDEPENDENT_AMBULATORY_CARE_PROVIDER_SITE_OTHER): Payer: 59 | Admitting: Family Medicine

## 2012-10-15 VITALS — BP 145/80 | HR 82 | Ht 63.0 in | Wt 167.0 lb

## 2012-10-15 DIAGNOSIS — F17200 Nicotine dependence, unspecified, uncomplicated: Secondary | ICD-10-CM

## 2012-10-15 DIAGNOSIS — Z23 Encounter for immunization: Secondary | ICD-10-CM

## 2012-10-15 DIAGNOSIS — I10 Essential (primary) hypertension: Secondary | ICD-10-CM

## 2012-10-15 DIAGNOSIS — L989 Disorder of the skin and subcutaneous tissue, unspecified: Secondary | ICD-10-CM

## 2012-10-15 DIAGNOSIS — Z720 Tobacco use: Secondary | ICD-10-CM

## 2012-10-15 DIAGNOSIS — R61 Generalized hyperhidrosis: Secondary | ICD-10-CM

## 2012-10-15 DIAGNOSIS — E119 Type 2 diabetes mellitus without complications: Secondary | ICD-10-CM

## 2012-10-15 LAB — POCT UA - MICROALBUMIN

## 2012-10-15 MED ORDER — PRAVASTATIN SODIUM 40 MG PO TABS: 40.0000 mg | ORAL_TABLET | Freq: Every day | ORAL | Status: DC

## 2012-10-15 MED ORDER — MUPIROCIN 2 % EX OINT: TOPICAL_OINTMENT | Freq: Two times a day (BID) | CUTANEOUS | Status: DC

## 2012-10-15 MED ORDER — METOPROLOL SUCCINATE ER 50 MG PO TB24: 50.0000 mg | ORAL_TABLET | Freq: Every day | ORAL | Status: DC

## 2012-10-15 MED ORDER — METFORMIN HCL 1000 MG PO TABS: 1000.0000 mg | ORAL_TABLET | Freq: Two times a day (BID) | ORAL | Status: DC

## 2012-10-15 MED ORDER — OLMESARTAN-AMLODIPINE-HCTZ 40-5-25 MG PO TABS: 1.0000 | ORAL_TABLET | Freq: Every day | ORAL | Status: DC

## 2012-10-15 MED ORDER — INSULIN GLARGINE 100 UNIT/ML ~~LOC~~ SOLN: 50.0000 [IU] | Freq: Every day | SUBCUTANEOUS | Status: DC

## 2012-10-15 NOTE — Patient Instructions (Addendum)
Call if spot behind your eye is not improving.  Smoking Cessation Quitting smoking is important to your health and has many advantages. However, it is not always easy to quit since nicotine is a very addictive drug. Often times, people try 3 times or more before being able to quit. This document explains the best ways for you to prepare to quit smoking. Quitting takes hard work and a lot of effort, but you can do it. ADVANTAGES OF QUITTING SMOKING  You will live longer, feel better, and live better.   Your body will feel the impact of quitting smoking almost immediately.   Within 20 minutes, blood pressure decreases. Your pulse returns to its normal level.   After 8 hours, carbon monoxide levels in the blood return to normal. Your oxygen level increases.   After 24 hours, the chance of having a heart attack starts to decrease. Your breath, hair, and body stop smelling like smoke.   After 48 hours, damaged nerve endings begin to recover. Your sense of taste and smell improve.   After 72 hours, the body is virtually free of nicotine. Your bronchial tubes relax and breathing becomes easier.   After 2 to 12 weeks, lungs can hold more air. Exercise becomes easier and circulation improves.   The risk of having a heart attack, stroke, cancer, or lung disease is greatly reduced.   After 1 year, the risk of coronary heart disease is cut in half.   After 5 years, the risk of stroke falls to the same as a nonsmoker.   After 10 years, the risk of lung cancer is cut in half and the risk of other cancers decreases significantly.   After 15 years, the risk of coronary heart disease drops, usually to the level of a nonsmoker.   If you are pregnant, quitting smoking will improve your chances of having a healthy baby.   The people you live with, especially any children, will be healthier.   You will have extra money to spend on things other than cigarettes.  QUESTIONS TO THINK ABOUT BEFORE  ATTEMPTING TO QUIT You may want to talk about your answers with your caregiver.  Why do you want to quit?   If you tried to quit in the past, what helped and what did not?   What will be the most difficult situations for you after you quit? How will you plan to handle them?   Who can help you through the tough times? Your family? Friends? A caregiver?   What pleasures do you get from smoking? What ways can you still get pleasure if you quit?  Here are some questions to ask your caregiver:  How can you help me to be successful at quitting?   What medicine do you think would be best for me and how should I take it?   What should I do if I need more help?   What is smoking withdrawal like? How can I get information on withdrawal?  GET READY  Set a quit date.   Change your environment by getting rid of all cigarettes, ashtrays, matches, and lighters in your home, car, or work. Do not let people smoke in your home.   Review your past attempts to quit. Think about what worked and what did not.  GET SUPPORT AND ENCOURAGEMENT You have a better chance of being successful if you have help. You can get support in many ways.  Tell your family, friends, and co-workers that you are  going to quit and need their support. Ask them not to smoke around you.   Get individual, group, or telephone counseling and support. Programs are available at [employer] and health centers. Call your local health department for information about programs in your area.   Spiritual beliefs and practices may help some smokers quit.   Download a "quit meter" on your computer to keep track of quit statistics, such as how long you have gone without smoking, cigarettes not smoked, and money saved.   Get a self-help book about quitting smoking and staying off of tobacco.  LEARN NEW SKILLS AND BEHAVIORS  Distract yourself from urges to smoke. Talk to someone, go for a walk, or occupy your time with a task.    Change your normal routine. Take a different route to work. Drink tea instead of coffee. Eat breakfast in a different place.   Reduce your stress. Take a hot bath, exercise, or read a book.   Plan something enjoyable to do every day. Reward yourself for not smoking.   Explore interactive web-based programs that specialize in helping you quit.  GET MEDICINE AND USE IT CORRECTLY Medicines can help you stop smoking and decrease the urge to smoke. Combining medicine with the above behavioral methods and support can greatly increase your chances of successfully quitting smoking.  Nicotine replacement therapy helps deliver nicotine to your body without the negative effects and risks of smoking. Nicotine replacement therapy includes nicotine gum, lozenges, inhalers, nasal sprays, and skin patches. Some may be available over-the-counter and others require a prescription.   Antidepressant medicine helps people abstain from smoking, but how this works is unknown. This medicine is available by prescription.   Nicotinic receptor partial agonist medicine simulates the effect of nicotine in your brain. This medicine is available by prescription.  Ask your caregiver for advice about which medicines to use and how to use them based on your health history. Your caregiver will tell you what side effects to look out for if you choose to be on a medicine or therapy. Carefully read the information on the package. Do not use any other product containing nicotine while using a nicotine replacement product.   RELAPSE OR DIFFICULT SITUATIONS Most relapses occur within the first 3 months after quitting. Do not be discouraged if you start smoking again. Remember, most people try several times before finally quitting. You may have symptoms of withdrawal because your body is used to nicotine. You may crave cigarettes, be irritable, feel very hungry, cough often, get headaches, or have difficulty concentrating. The  withdrawal symptoms are only temporary. They are strongest when you first quit, but they will go away within 10 14 days. To reduce the chances of relapse, try to:  Avoid drinking alcohol. Drinking lowers your chances of successfully quitting.   Reduce the amount of caffeine you consume. Once you quit smoking, the amount of caffeine in your body increases and can give you symptoms, such as a rapid heartbeat, sweating, and anxiety.   Avoid smokers because they can make you want to smoke.   Do not let weight gain distract you. Many smokers will gain weight when they quit, usually less than 10 pounds. Eat a healthy diet and stay active. You can always lose the weight gained after you quit.   Find ways to improve your mood other than smoking.  FOR MORE INFORMATION   www.smokefree.gov   Document Released: 08/21/2001 Document Revised: 02/26/2012 Document Reviewed: 12/06/2011 [facility] Patient Information 2013 [place].

## 2012-10-15 NOTE — Progress Notes (Signed)
  Subjective:    Patient ID: Sabrina Dougherty, female    DOB: 21-Apr-1961, 52 y.o.   MRN: [id number]  HPI HTN- Didn't take her meds today.   Pt denies chest pain, SOB, dizziness, or heart palpitations.  Taking meds as directed w/o problems.  Denies medication side effects.    DM- has been checking her sugars.  Has been working out some at [employer]  No hypoglycemic events.  No cuts or sorest that aren't healing well.  Most sugars are running under 120.  Has been out of her statin for a couple of months.   Having nightsweats several months ago. Periods are very irregular. Last one was about 3 months.  No daytime hotflashes.  Not sleeping as well.  No mood changes.  More vaginal dryness.    Lesion behind right ear-she noticed it about a week ago. She says it's a little bit tender and itchy ears she did not have any pain before the bump appeared. She's not been treating it with anything. It has not been bleeding or oozing. She wonders if it could be shingles.  Review of Systems     Objective:   Physical Exam  Constitutional: She is oriented to person, place, and time. She appears well-developed and well-nourished.  HENT:  Head: Normocephalic and atraumatic.  Cardiovascular: Normal rate, regular rhythm and normal heart sounds.   Pulmonary/Chest: Effort normal and breath sounds normal.  Neurological: She is alert and oriented to person, place, and time.  Skin: Skin is warm and dry.       Approximately half a centimeter round scab behind the right ear, along the hairline. No active drainage, edema or surrounding erythema.  Psychiatric: She has a normal mood and affect. Her behavior is normal.          Assessment & Plan:  HTN  - uncontrolled today but didn't take meds this AM.  F/U in 3 months.  Continue work on low-salt diet.  DM- Well controlled.  A1C is 6.4.  Continue with diet and exercise. I'm very part of her and she is on fantastic with her weight loss and trying to work out  regularly. On ASA, ARB and statin. Continue current regimen. Pneumonia vaccine and flu vaccines updated today. She has her eye exam scheduled for Monday.  Nightsweats - Will check CBC and thyroid ot r/u abnormalities.  Explained this certainly could be hormonal. Discussed risks of HRT.  She is not interested at this time.  She reports her mammogram is up-to-date. She's actually due this month. She's had received a letter from them.  Lesion behind the right ear-gave reassurance. Right now it is a scab. We'll treat with Bactroban ointment. And not improving in the next week then recommend shave biopsy for further evaluation. I reassured her that does not look like shingles.  Needs Pneumonia vaccine and flu shot.     tobacco abuse-congratulated her on quitting. She says she quit about 9 months ago.

## 2013-02-03 ENCOUNTER — Ambulatory Visit: Payer: 59 | Admitting: Family Medicine

## 2013-02-06 ENCOUNTER — Encounter: Payer: Self-pay | Admitting: Family Medicine

## 2013-02-06 ENCOUNTER — Ambulatory Visit (INDEPENDENT_AMBULATORY_CARE_PROVIDER_SITE_OTHER): Payer: 59 | Admitting: Family Medicine

## 2013-02-06 VITALS — BP 155/76 | HR 82 | Ht 63.0 in | Wt 166.0 lb

## 2013-02-06 DIAGNOSIS — F17200 Nicotine dependence, unspecified, uncomplicated: Secondary | ICD-10-CM

## 2013-02-06 DIAGNOSIS — B081 Molluscum contagiosum: Secondary | ICD-10-CM

## 2013-02-06 DIAGNOSIS — Z23 Encounter for immunization: Secondary | ICD-10-CM

## 2013-02-06 DIAGNOSIS — E119 Type 2 diabetes mellitus without complications: Secondary | ICD-10-CM

## 2013-02-06 DIAGNOSIS — I10 Essential (primary) hypertension: Secondary | ICD-10-CM

## 2013-02-06 DIAGNOSIS — Z720 Tobacco use: Secondary | ICD-10-CM

## 2013-02-06 MED ORDER — IMIQUIMOD 5 % EX CREA: TOPICAL_CREAM | CUTANEOUS | Status: DC

## 2013-02-06 NOTE — Progress Notes (Signed)
Subjective:    Patient ID: Sabrina Dougherty, female    DOB: 1961-01-23, 52 y.o.   MRN: [id number]  HPI DM - Says sugar bottomed out yesterday and called EMS so didn't take her BP pills today. Says he ate normally.  Hasn't been exercising as much as usuall. But plans to start again. On 50 units of lantus. Had eye appt scheduled in August.    HTN -  Pt denies chest pain, SOB, dizziness, or heart palpitations.  Taking meds as directed w/o problems.  Denies medication side effects.   Lesion behind the left ear - she has a lesion behind her left ear and has been there for quite some time. She wanted me to look at it today. It otherwise not bothersome or painful  Review of Systems     BP 155/76  Pulse 82  Ht 5\' 3"  (1.6 m)  Wt 166 lb (75.297 kg)  BMI 29.41 kg/m2    Allergies  Allergen Reactions  . Ace Inhibitors   . Amoxicillin-Pot Clavulanate     REACTION: skin peeling  . Propoxyphene Hcl     Past Medical History  Diagnosis Date  . Diabetes mellitus   . Stroke 1990s    left middle cerebral artery stroke  . Stroke 02/2012    left temporal infarct    History reviewed. No pertinent past surgical history.  History   Social History  . Marital Status: Married    Spouse Name: N/A    Number of Children: N/A  . Years of Education: N/A   Occupational History  . Not on file.   Social History Main Topics  . Smoking status: Current Every Day Smoker -- 0.10 packs/day    Types: Cigarettes    Last Attempt to Quit: 01/13/2012  . Smokeless tobacco: Not on file  . Alcohol Use: 1.5 oz/week    3 drink(s) per week  . Drug Use: No  . Sexually Active: Yes   Other Topics Concern  . Not on file   Social History Narrative  . No narrative on file    Family History  Problem Relation Age of Onset  . Alcohol abuse Mother   . Diabetes Sister     Outpatient Encounter Prescriptions as of 02/06/2013  Medication Sig Dispense Refill  . AMBULATORY NON FORMULARY MEDICATION  Medication Name: Freestyle Promise Insulin X strips; glucometer checks BID  50 each  2  . aspirin 81 MG tablet Take 81 mg by mouth daily.      . insulin glargine (LANTUS) 100 UNIT/ML injection Inject 50 Units into the skin at bedtime.  15 pen  1  . metFORMIN (GLUCOPHAGE) 1000 MG tablet Take 1 tablet (1,000 mg total) by mouth 2 (two) times daily with a meal.  180 tablet  1  . metoprolol succinate (TOPROL-XL) 50 MG 24 hr tablet Take 1 tablet (50 mg total) by mouth daily.  90 tablet  1  . mupirocin ointment (BACTROBAN) 2 % Apply topically 2 (two) times daily.  15 g  0  . Olmesartan-Amlodipine-HCTZ (TRIBENZOR) 40-5-25 MG TABS Take 1 tablet by mouth daily.  90 tablet  1  . OMEGA 3 1000 MG CAPS Take by mouth.        . pravastatin (PRAVACHOL) 40 MG tablet Take 1 tablet (40 mg total) by mouth at bedtime.  90 tablet  3  . imiquimod (ALDARA) 5 % cream Apply to affected area three times weekly  12 each  2  . [DISCONTINUED] metoprolol succinate (  TOPROL-XL) 100 MG 24 hr tablet Take 50 mg by mouth daily.       No facility-administered encounter medications on file as of 02/06/2013.        Objective:   Physical Exam  Constitutional: She is oriented to person, place, and time. She appears well-developed and well-nourished.  HENT:  Head: Normocephalic and atraumatic.  Cardiovascular: Normal rate, regular rhythm and normal heart sounds.   Pulmonary/Chest: Effort normal and breath sounds normal.  Neurological: She is alert and oriented to person, place, and time.  Skin: Skin is warm and dry.  She has what looks like a erythematous/pink papule behind the left ear that is umbilicated.  Psychiatric: She has a normal mood and affect. Her behavior is normal.          Assessment & Plan:  DM- Decrease Lantus ot 45 untis bc of hypoglycemic events. On statin, ASA, and ACE. Has eye appt scheduled.  Once she starts working out regularly been decreased down to 40 units. She still experiences hypoglycemic events  and please call me. Her diabetes is well-controlled her looks absolutely fantastic today. Pneumonia vaccine given and updated. She has a scheduled eye appointment in August.  HTN - Uncontrolled today but skipped her pills bc called EMS yesterday.   tob abuse - started smoking again. Has been smoking one a day. Encourage cessation. We discussed other ways to help relieve her stress.  Molluscum contagiosum-will treat with topical Aldara. Call if not improving over the next month. Also consider biopsy if not resolving.

## 2013-02-06 NOTE — Patient Instructions (Signed)
Decrease the lantus 45 units.  Once exercising regularly then decrease to 40 units.

## 2013-02-09 NOTE — Addendum Note (Signed)
Addended by: Deno Etienne on: 02/09/2013 04:20 PM   Modules accepted: Orders

## 2013-02-12 ENCOUNTER — Telehealth: Payer: Self-pay | Admitting: *Deleted

## 2013-02-12 NOTE — Telephone Encounter (Signed)
Pt calls back to let me know that after she ate a sundae her sugar only went up to 44. She said that her husband was going to the store to pick up 3 different kinds of juices for her.  I advised her to drink a big glass when he gets home & to recheck it again 25-30 after.  I also advised her to keep an eye on her sugars today to make sure they are trending up & to call us if they stay low or drop significantly again.

## 2013-02-12 NOTE — Telephone Encounter (Signed)
Pt calls & states that her blood sugar this morning is 31.  I advised her to eat & drink some sort of juice with some sugar in it. She has not taken her insulin yet this morning.  Is she still taking too much insulin? Please advise

## 2013-02-13 ENCOUNTER — Telehealth: Payer: Self-pay | Admitting: *Deleted

## 2013-02-13 NOTE — Telephone Encounter (Signed)
Left detailed message on home phone to hold Lantus and use metformin. Cell number in chart is the wrong number. Barry Dienes, LPN

## 2013-02-13 NOTE — Telephone Encounter (Signed)
Hold her lantus for now and just take the metformin.

## 2013-02-13 NOTE — Telephone Encounter (Signed)
Did not take yesterday due to BS being low. Last night sugar was 213 with no meds and this morning it is 101. Wants to know what she needs to do. Barry Dienes, LPN

## 2013-02-17 ENCOUNTER — Telehealth: Payer: Self-pay | Admitting: *Deleted

## 2013-02-17 NOTE — Telephone Encounter (Signed)
Have her restart with 10 units of Lantus at bedtime and make sure taking metformin and see how her sugars does on that regimen.

## 2013-02-17 NOTE — Telephone Encounter (Signed)
Pt calls & states that her blood sugars were 121 fasting on sat, 220 at lunch sat, 230 at dinner sat; 180 fasting on sun, 222 at lunch on Sunday, & 234 at dinner on sun.  Monday was 215 fasting, 216 at lunch, 255 at dinner.  These are all with no insulin but with metformin. This morning's fasting sugar was 318, lunch was 306; still not taking insulin. She states that last Friday night she did 30 units of insulin (did not take metformin that day) and the results were 121 the next morning.

## 2013-02-17 NOTE — Telephone Encounter (Signed)
Opened in error

## 2013-02-18 ENCOUNTER — Other Ambulatory Visit: Payer: Self-pay | Admitting: *Deleted

## 2013-02-18 MED ORDER — METFORMIN HCL 1000 MG PO TABS: 1000.0000 mg | ORAL_TABLET | Freq: Two times a day (BID) | ORAL | Status: DC

## 2013-02-18 MED ORDER — INSULIN GLARGINE 100 UNIT/ML ~~LOC~~ SOLN: 50.0000 [IU] | Freq: Every day | SUBCUTANEOUS | Status: DC

## 2013-02-18 MED ORDER — OLMESARTAN-AMLODIPINE-HCTZ 40-5-25 MG PO TABS: 1.0000 | ORAL_TABLET | Freq: Every day | ORAL | Status: DC

## 2013-02-18 NOTE — Telephone Encounter (Signed)
Pt notified of MD instructions. Kimberly Gordon, LPN  

## 2013-02-18 NOTE — Telephone Encounter (Signed)
Tried to call pt back at home number and VM has not been set up and mobile number is the wrong number. Sabrina Dienes, LPN

## 2013-02-19 ENCOUNTER — Other Ambulatory Visit: Payer: Self-pay | Admitting: *Deleted

## 2013-02-19 MED ORDER — METFORMIN HCL 1000 MG PO TABS: 1000.0000 mg | ORAL_TABLET | Freq: Every day | ORAL | Status: DC

## 2013-03-19 ENCOUNTER — Other Ambulatory Visit: Payer: Self-pay

## 2013-03-20 ENCOUNTER — Ambulatory Visit: Payer: 59 | Admitting: Family Medicine

## 2013-08-17 ENCOUNTER — Other Ambulatory Visit: Payer: Self-pay | Admitting: *Deleted

## 2013-08-17 ENCOUNTER — Telehealth: Payer: Self-pay | Admitting: Family Medicine

## 2013-08-17 DIAGNOSIS — I10 Essential (primary) hypertension: Secondary | ICD-10-CM

## 2013-08-17 DIAGNOSIS — I6350 Cerebral infarction due to unspecified occlusion or stenosis of unspecified cerebral artery: Secondary | ICD-10-CM

## 2013-08-17 NOTE — Telephone Encounter (Signed)
Patient called she has an appointment tomorrow and request to have lab order sent downstairs (fasting labs). Patient stated that her daughter had a recent appointment but she did not get a chance to get her labs because she was in so much pain so they want to both get lab work done tomorrow morning. Thanks-

## 2013-08-18 ENCOUNTER — Ambulatory Visit: Payer: 59 | Admitting: Family Medicine

## 2013-08-19 LAB — COMPLETE METABOLIC PANEL WITH GFR
ALT: 23 U/L (ref 0–35)
AST: 15 U/L (ref 0–37)
Albumin: 4.4 g/dL (ref 3.5–5.2)
Calcium: 9.9 mg/dL (ref 8.4–10.5)
Chloride: 101 mEq/L (ref 96–112)
Creat: 0.73 mg/dL (ref 0.50–1.10)
Potassium: 4.3 mEq/L (ref 3.5–5.3)
Sodium: 139 mEq/L (ref 135–145)
Total Protein: 7.7 g/dL (ref 6.0–8.3)

## 2013-08-19 LAB — TSH: TSH: 0.703 u[IU]/mL (ref 0.350–4.500)

## 2013-08-19 LAB — LIPID PANEL: LDL Cholesterol: 111 mg/dL — ABNORMAL HIGH (ref 0–99)

## 2013-08-24 ENCOUNTER — Encounter: Payer: Self-pay | Admitting: Family Medicine

## 2013-08-24 ENCOUNTER — Ambulatory Visit (INDEPENDENT_AMBULATORY_CARE_PROVIDER_SITE_OTHER): Payer: 59 | Admitting: Family Medicine

## 2013-08-24 VITALS — BP 140/76 | HR 76 | Temp 97.0°F | Ht 62.0 in | Wt 150.0 lb

## 2013-08-24 DIAGNOSIS — J069 Acute upper respiratory infection, unspecified: Secondary | ICD-10-CM

## 2013-08-24 DIAGNOSIS — I10 Essential (primary) hypertension: Secondary | ICD-10-CM

## 2013-08-24 DIAGNOSIS — E119 Type 2 diabetes mellitus without complications: Secondary | ICD-10-CM

## 2013-08-24 DIAGNOSIS — Z1231 Encounter for screening mammogram for malignant neoplasm of breast: Secondary | ICD-10-CM

## 2013-08-24 DIAGNOSIS — Z1211 Encounter for screening for malignant neoplasm of colon: Secondary | ICD-10-CM

## 2013-08-24 MED ORDER — METFORMIN HCL 500 MG PO TABS: 500.0000 mg | ORAL_TABLET | Freq: Two times a day (BID) | ORAL | Status: DC

## 2013-08-24 MED ORDER — METFORMIN HCL ER 500 MG PO TB24: 500.0000 mg | ORAL_TABLET | Freq: Every day | ORAL | Status: DC

## 2013-08-24 MED ORDER — LACTULOSE ENCEPHALOPATHY 10 GM/15ML PO SOLN: 30.0000 g | Freq: Two times a day (BID) | ORAL | Status: DC

## 2013-08-24 NOTE — Progress Notes (Signed)
   Subjective:    Patient ID: Sabrina Dougherty, female    DOB: 03/06/1961, 52 y.o.   MRN: [id number]  HPI DM- Was having lows so stopped her medication and has been intermittantly checking her sugars. She has lost 16 lbs. Was working out regularly, until her daughter got placed in a rehabilitation center. And she was visiting her daily and not eating well. She was eating out and eating from a snack machine and not being able to work out. This was for the last month.  4 days of nasal congestion, scratchy throat.  No ear pain or pressure. No fever, chills or sweats.   No OTC medication   HTN- Thinks BP up bc doesn't feel well today.  Pt denies chest pain, SOB, dizziness, or heart palpitations.  Taking meds as directed w/o problems.  Denies medication side effects.     Review of Systems       Objective:   Physical Exam  Constitutional: She is oriented to person, place, and time. She appears well-developed and well-nourished.  HENT:  Head: Normocephalic and atraumatic.  Right Ear: External ear normal.  Left Ear: External ear normal.  Nose: Nose normal.  Mouth/Throat: Oropharynx is clear and moist.  TMs and canals are clear.   Eyes: Conjunctivae and EOM are normal. Pupils are equal, round, and reactive to light.  Neck: Neck supple. No thyromegaly present.  Cardiovascular: Normal rate, regular rhythm and normal heart sounds.   Pulmonary/Chest: Effort normal and breath sounds normal. She has no wheezes.  Lymphadenopathy:    She has no cervical adenopathy.  Neurological: She is alert and oriented to person, place, and time.  Skin: Skin is warm and dry.  Psychiatric: She has a normal mood and affect.          Assessment & Plan:  DM - uncontrolled. Sounds like overall she was making some great progress. She's lost 16 pounds was working out regularly and eating more healthy. But the last month she's been unable to do this. I suspect that that may have caused a significant bump  in her blood sugars. She originally stop the Lantus because of lows. We'll continue to hold Lantus but will restart metformin 500 mg once a day extended release. Followup in 3 months. She reports that her eye exam is up-to-date. We'll call to get a report.  URI - likely viral. Given H.O. Continue symptomatic care. Call if not feeling better by the end of the week or suddenly gets worse.  HTN - borderline elevated today. She has been keeping an eye on it and says it has looked good she is doesn't feel well today. We'll recheck again in March.  Will refer for mammogram and screening colonoscopy. Patient prefers [facility] location as she lives there.

## 2013-09-01 ENCOUNTER — Encounter: Payer: Self-pay | Admitting: Family Medicine

## 2013-09-01 ENCOUNTER — Ambulatory Visit (INDEPENDENT_AMBULATORY_CARE_PROVIDER_SITE_OTHER): Payer: 59 | Admitting: Family Medicine

## 2013-09-01 VITALS — BP 141/84 | HR 78 | Temp 97.9°F | Wt 149.0 lb

## 2013-09-01 DIAGNOSIS — A499 Bacterial infection, unspecified: Secondary | ICD-10-CM

## 2013-09-01 DIAGNOSIS — B9689 Other specified bacterial agents as the cause of diseases classified elsewhere: Secondary | ICD-10-CM

## 2013-09-01 DIAGNOSIS — R05 Cough: Secondary | ICD-10-CM

## 2013-09-01 DIAGNOSIS — J209 Acute bronchitis, unspecified: Secondary | ICD-10-CM

## 2013-09-01 DIAGNOSIS — R059 Cough, unspecified: Secondary | ICD-10-CM

## 2013-09-01 MED ORDER — AZITHROMYCIN 250 MG PO TABS: ORAL_TABLET | ORAL | Status: AC

## 2013-09-01 MED ORDER — HYDROCODONE-HOMATROPINE 5-1.5 MG/5ML PO SYRP: 5.0000 mL | ORAL_SOLUTION | Freq: Four times a day (QID) | ORAL | Status: DC | PRN

## 2013-09-01 NOTE — Progress Notes (Signed)
CC: Sabrina Dougherty is a 52 y.o. female is here for Cough   Subjective: HPI:  Complains of cough that has been present for 2 weeks worsening on a daily basis. Described as moderate to severe in severity present all hours of the day significantly interfere with sleep despite taking NyQuil generic product. Cough is described as nonproductive without blood and sputum. Accompanied by wheezing.  She thinks she has had one episode of posttussive emesis.  She is a smoker. She reports fatigue but denies fevers, chills, chest pain, confusion, nor motor or sensory disturbances   Review Of Systems Outlined In HPI  Past Medical History  Diagnosis Date  . Diabetes mellitus   . Stroke 1990s    left middle cerebral artery stroke  . Stroke 02/2012    left temporal infarct     Family History  Problem Relation Age of Onset  . Alcohol abuse Mother   . Diabetes Sister      History  Substance Use Topics  . Smoking status: Current Every Day Smoker -- 0.10 packs/day    Types: Cigarettes    Last Attempt to Quit: 01/13/2012  . Smokeless tobacco: Not on file  . Alcohol Use: 1.5 oz/week    3 drink(s) per week     Objective: Filed Vitals:   09/01/13 1016  BP: 141/84  Pulse: 78  Temp: 97.9 F (36.6 C)    General: Alert and Oriented, No Acute Distress HEENT: Pupils equal, round, reactive to light. Conjunctivae clear.  External ears unremarkable, canals clear with intact TMs with appropriate landmarks.  Middle ear appears open without effusion. Pink inferior turbinates.  Moist mucous membranes, pharynx without inflammation nor lesions.  Neck supple without palpable lymphadenopathy nor abnormal masses. Lungs: Trace end expiratory rhonchi in all lung fields without wheezing rales or signs of consolidation. Frequently coughing during the exam Cardiac: Regular rate and rhythm. Normal S1/S2.  No murmurs, rubs, nor gallops.   Mental Status: No depression, anxiety, nor agitation. Skin: Warm and  dry.  Assessment & Plan: Evalette was seen today for cough.  Diagnoses and associated orders for this visit:  Cough - HYDROcodone-homatropine (HYCODAN) 5-1.5 MG/5ML syrup; Take 5 mLs by mouth every 6 (six) hours as needed for cough. - azithromycin (ZITHROMAX) 250 MG tablet; Take two tabs at once on day 1, then one tab daily on days 2-5.  Acute bacterial bronchitis - HYDROcodone-homatropine (HYCODAN) 5-1.5 MG/5ML syrup; Take 5 mLs by mouth every 6 (six) hours as needed for cough. - azithromycin (ZITHROMAX) 250 MG tablet; Take two tabs at once on day 1, then one tab daily on days 2-5.    Cough secondary to acute bacterial bronchitis start azithromycin given posttussive emesis and smoking history, as needed Hycodan  Return if symptoms worsen or fail to improve.

## 2013-10-21 ENCOUNTER — Other Ambulatory Visit: Payer: Self-pay | Admitting: *Deleted

## 2013-10-21 MED ORDER — OLMESARTAN-AMLODIPINE-HCTZ 40-5-25 MG PO TABS: 1.0000 | ORAL_TABLET | Freq: Every day | ORAL | Status: DC

## 2013-11-25 ENCOUNTER — Encounter: Payer: Self-pay | Admitting: Physician Assistant

## 2013-11-25 ENCOUNTER — Ambulatory Visit (INDEPENDENT_AMBULATORY_CARE_PROVIDER_SITE_OTHER): Payer: 59

## 2013-11-25 ENCOUNTER — Ambulatory Visit (INDEPENDENT_AMBULATORY_CARE_PROVIDER_SITE_OTHER): Payer: 59 | Admitting: Physician Assistant

## 2013-11-25 VITALS — BP 139/70 | HR 71 | Wt 157.0 lb

## 2013-11-25 DIAGNOSIS — M5030 Other cervical disc degeneration, unspecified cervical region: Secondary | ICD-10-CM

## 2013-11-25 DIAGNOSIS — M542 Cervicalgia: Secondary | ICD-10-CM

## 2013-11-25 MED ORDER — IBUPROFEN 800 MG PO TABS: 800.0000 mg | ORAL_TABLET | Freq: Three times a day (TID) | ORAL | Status: DC | PRN

## 2013-11-25 MED ORDER — CYCLOBENZAPRINE HCL 10 MG PO TABS: 10.0000 mg | ORAL_TABLET | Freq: Three times a day (TID) | ORAL | Status: DC | PRN

## 2013-11-25 MED ORDER — TRAMADOL HCL 50 MG PO TABS
ORAL_TABLET | ORAL | Status: DC
Start: 2013-11-25 — End: 2014-11-02

## 2013-11-25 NOTE — Progress Notes (Signed)
   Subjective:    Patient ID: Sabrina Dougherty, female    DOB: 10/04/1960, 53 y.o.   MRN: [id number]  HPI Pt is a 53 yo woman who presents to the clinic with neck pain since Monday morning. She woke up with the pain and stiffness. They only thing she contribute it to was going to the gym and landing hard on her feet and felt some initial pain up spine but she was fine on Sunday night. No other sleeping habits have changed. Denies any numbness or tingling down arms or legs. No back pain. Denies any fever, chills, sinus pressure, ear pain. Not tried anything. Seems to be stable but not getting any better.  She did get a new pillow which did not help. She has a lot of pain with any movement of her neck.      Review of Systems     Objective:   Physical Exam  Constitutional: She is oriented to person, place, and time. She appears well-developed and well-nourished.  HENT:  Head: Normocephalic and atraumatic.  Cardiovascular: Normal rate, regular rhythm and normal heart sounds.   Musculoskeletal:  ROM of neck limited in all directions. Strength of neck 5/5. Negative spurlings sign. Bilateral arm strength 5/5. Hand grip normal bilaterally. Muscle tightness over trapezius. No pain with palpation over c-spine.   Neurological: She is alert and oriented to person, place, and time.  Psychiatric: She has a normal mood and affect. Her behavior is normal.          Assessment & Plan:  Neck pain- suspect musculoskeletal injury/strain. Pt does not have any radiculopathy symptoms. Will get c-spine xray today. Gave muscle relaxer, ibuprofen, tramadol for break through pain. Exercise given for pt to start 2-3 times a day. Consider alternate ice and heat. Follow up if worsening for not improving in next week or so.

## 2013-11-25 NOTE — Patient Instructions (Signed)
Cervical Strain and Sprain (Whiplash)  with Rehab  Cervical strain and sprains are injuries that commonly occur with "whiplash" injuries. Whiplash occurs when the neck is forcefully whipped backward or forward, such as during a motor vehicle accident. The muscles, ligaments, tendons, discs and nerves of the neck are susceptible to injury when this occurs.  SYMPTOMS   · Pain or stiffness in the front and/or back of neck  · Symptoms may present immediately or up to 24 hours after injury.  · Dizziness, headache, nausea and vomiting.  · Muscle spasm with soreness and stiffness in the neck.  · Tenderness and swelling at the injury site.  CAUSES   Whiplash injuries often occur during contact sports or motor vehicle accidents.   RISK INCREASES WITH:  · Osteoarthritis of the spine.  · Situations that make head or neck accidents or trauma more likely.  · High-risk sports (football, rugby, wrestling, hockey, auto racing, gymnastics, diving, contact karate or boxing).  · Poor strength and flexibility of the neck.  · Previous neck injury.  · Poor tackling technique.  · Improperly fitted or padded equipment.  PREVENTION  · Learn and use proper technique (avoid tackling with the head, spearing and head-butting; use proper falling techniques to avoid landing on the head).  · Warm up and stretch properly before activity.  · Maintain physical fitness:  · Strength, flexibility and endurance.  · Cardiovascular fitness.  · Wear properly fitted and padded protective equipment, such as padded soft collars, for participation in contact sports.  PROGNOSIS   Recovery for cervical strain and sprain injuries is dependent on the extent of the injury. These injuries are usually curable in 1 week to 3 months with appropriate treatment.   RELATED COMPLICATIONS   · Temporary numbness and weakness may occur if the nerve roots are damaged, and this may persist until the nerve has completely healed.  · Chronic pain due to frequent recurrence of  symptoms.  · Prolonged healing, especially if activity is resumed too soon (before complete recovery).  TREATMENT   Treatment initially involves the use of ice and medication to help reduce pain and inflammation. It is also important to perform strengthening and stretching exercises and modify activities that worsen symptoms so the injury does not get worse. These exercises may be performed at home or with a therapist. For patients who experience severe symptoms, a soft padded collar may be recommended to be worn around the neck.   Improving your posture may help reduce symptoms. Posture improvement includes pulling your chin and abdomen in while sitting or standing. If you are sitting, sit in a firm chair with your buttocks against the back of the chair. While sleeping, try replacing your pillow with a small towel rolled to 2 inches in diameter, or use a cervical pillow or soft cervical collar. Poor sleeping positions delay healing.   For patients with nerve root damage, which causes numbness or weakness, the use of a cervical traction apparatus may be recommended. Surgery is rarely necessary for these injuries. However, cervical strain and sprains that are present at birth (congenital) may require surgery.  MEDICATION   · If pain medication is necessary, nonsteroidal anti-inflammatory medications, such as aspirin and ibuprofen, or other minor pain relievers, such as acetaminophen, are often recommended.  · Do not take pain medication for 7 days before surgery.  · Prescription pain relievers may be given if deemed necessary by your caregiver. Use only as directed and only as much as you   need.  HEAT AND COLD:   · Cold treatment (icing) relieves pain and reduces inflammation. Cold treatment should be applied for 10 to 15 minutes every 2 to 3 hours for inflammation and pain and immediately after any activity that aggravates your symptoms. Use ice packs or an ice massage.  · Heat treatment may be used prior to  performing the stretching and strengthening activities prescribed by your caregiver, physical therapist, or athletic trainer. Use a heat pack or a warm soak.  SEEK MEDICAL CARE IF:   · Symptoms get worse or do not improve in 2 weeks despite treatment.  · New, unexplained symptoms develop (drugs used in treatment may produce side effects).  EXERCISES  RANGE OF MOTION (ROM) AND STRETCHING EXERCISES - Cervical Strain and Sprain  These exercises may help you when beginning to rehabilitate your injury. In order to successfully resolve your symptoms, you must improve your posture. These exercises are designed to help reduce the forward-head and rounded-shoulder posture which contributes to this condition. Your symptoms may resolve with or without further involvement from your physician, physical therapist or athletic trainer. While completing these exercises, remember:   · Restoring tissue flexibility helps normal motion to return to the joints. This allows healthier, less painful movement and activity.  · An effective stretch should be held for at least 20 seconds, although you may need to begin with shorter hold times for comfort.  · A stretch should never be painful. You should only feel a gentle lengthening or release in the stretched tissue.  STRETCH- Axial Extensors  · Lie on your back on the floor. You may bend your knees for comfort. Place a rolled up hand towel or dish towel, about 2 inches in diameter, under the part of your head that makes contact with the floor.  · Gently tuck your chin, as if trying to make a "double chin," until you feel a gentle stretch at the base of your head.  · Hold __________ seconds.  Repeat __________ times. Complete this exercise __________ times per day.   STRETECH - Axial Extension   · Stand or sit on a firm surface. Assume a good posture: chest up, shoulders drawn back, abdominal muscles slightly tense, knees unlocked (if standing) and feet hip width apart.  · Slowly retract your  chin so your head slides back and your chin slightly lowers.Continue to look straight ahead.  · You should feel a gentle stretch in the back of your head. Be certain not to feel an aggressive stretch since this can cause headaches later.  · Hold for __________ seconds.  Repeat __________ times. Complete this exercise __________ times per day.  STRETCH  Cervical Side Bend   · Stand or sit on a firm surface. Assume a good posture: chest up, shoulders drawn back, abdominal muscles slightly tense, knees unlocked (if standing) and feet hip width apart.  · Without letting your nose or shoulders move, slowly tip your right / left ear to your shoulder until your feel a gentle stretch in the muscles on the opposite side of your neck.  · Hold __________ seconds.  Repeat __________ times. Complete this exercise __________ times per day.  STRETCH  Cervical Rotators   · Stand or sit on a firm surface. Assume a good posture: chest up, shoulders drawn back, abdominal muscles slightly tense, knees unlocked (if standing) and feet hip width apart.  · Keeping your eyes level with the ground, slowly turn your head until you feel a gentle   stretch along the back and opposite side of your neck.  · Hold __________ seconds.  Repeat __________ times. Complete this exercise __________ times per day.  RANGE OF MOTION - Neck Circles   · Stand or sit on a firm surface. Assume a good posture: chest up, shoulders drawn back, abdominal muscles slightly tense, knees unlocked (if standing) and feet hip width apart.  · Gently roll your head down and around from the back of one shoulder to the back of the other. The motion should never be forced or painful.  · Repeat the motion 10-20 times, or until you feel the neck muscles relax and loosen.  Repeat __________ times. Complete the exercise __________ times per day.  STRENGTHENING EXERCISES - Cervical Strain and Sprain  These exercises may help you when beginning to rehabilitate your injury. They may  resolve your symptoms with or without further involvement from your physician, physical therapist or athletic trainer. While completing these exercises, remember:   · Muscles can gain both the endurance and the strength needed for everyday activities through controlled exercises.  · Complete these exercises as instructed by your physician, physical therapist or athletic trainer. Progress the resistance and repetitions only as guided.  · You may experience muscle soreness or fatigue, but the pain or discomfort you are trying to eliminate should never worsen during these exercises. If this pain does worsen, stop and make certain you are following the directions exactly. If the pain is still present after adjustments, discontinue the exercise until you can discuss the trouble with your clinician.  STRENGTH Cervical Flexors, Isometric  · Face a wall, standing about 6 inches away. Place a small pillow, a ball about 6-8 inches in diameter, or a folded towel between your forehead and the wall.  · Slightly tuck your chin and gently push your forehead into the soft object. Push only with mild to moderate intensity, building up tension gradually. Keep your jaw and forehead relaxed.  · Hold 10 to 20 seconds. Keep your breathing relaxed.  · Release the tension slowly. Relax your neck muscles completely before you start the next repetition.  Repeat __________ times. Complete this exercise __________ times per day.  STRENGTH- Cervical Lateral Flexors, Isometric   · Stand about 6 inches away from a wall. Place a small pillow, a ball about 6-8 inches in diameter, or a folded towel between the side of your head and the wall.  · Slightly tuck your chin and gently tilt your head into the soft object. Push only with mild to moderate intensity, building up tension gradually. Keep your jaw and forehead relaxed.  · Hold 10 to 20 seconds. Keep your breathing relaxed.  · Release the tension slowly. Relax your neck muscles completely before  you start the next repetition.  Repeat __________ times. Complete this exercise __________ times per day.  STRENGTH  Cervical Extensors, Isometric   · Stand about 6 inches away from a wall. Place a small pillow, a ball about 6-8 inches in diameter, or a folded towel between the back of your head and the wall.  · Slightly tuck your chin and gently tilt your head back into the soft object. Push only with mild to moderate intensity, building up tension gradually. Keep your jaw and forehead relaxed.  · Hold 10 to 20 seconds. Keep your breathing relaxed.  · Release the tension slowly. Relax your neck muscles completely before you start the next repetition.  Repeat __________ times. Complete this exercise __________ times per   day.  POSTURE AND BODY MECHANICS CONSIDERATIONS - Cervical Strain and Sprain  Keeping correct posture when sitting, standing or completing your activities will reduce the stress put on different body tissues, allowing injured tissues a chance to heal and limiting painful experiences. The following are general guidelines for improved posture. Your physician or physical therapist will provide you with any instructions specific to your needs. While reading these guidelines, remember:  · The exercises prescribed by your provider will help you have the flexibility and strength to maintain correct postures.  · The correct posture provides the optimal environment for your joints to work. All of your joints have less wear and tear when properly supported by a spine with good posture. This means you will experience a healthier, less painful body.  · Correct posture must be practiced with all of your activities, especially prolonged sitting and standing. Correct posture is as important when doing repetitive low-stress activities (typing) as it is when doing a single heavy-load activity (lifting).  PROLONGED STANDING WHILE SLIGHTLY LEANING FORWARD  When completing a task that requires you to lean forward while  standing in one place for a long time, place either foot up on a stationary 2-4 inch high object to help maintain the best posture. When both feet are on the ground, the low back tends to lose its slight inward curve. If this curve flattens (or becomes too large), then the back and your other joints will experience too much stress, fatigue more quickly and can cause pain.   RESTING POSITIONS  Consider which positions are most painful for you when choosing a resting position. If you have pain with flexion-based activities (sitting, bending, stooping, squatting), choose a position that allows you to rest in a less flexed posture. You would want to avoid curling into a fetal position on your side. If your pain worsens with extension-based activities (prolonged standing, working overhead), avoid resting in an extended position such as sleeping on your stomach. Most people will find more comfort when they rest with their spine in a more neutral position, neither too rounded nor too arched. Lying on a non-sagging bed on your side with a pillow between your knees, or on your back with a pillow under your knees will often provide some relief. Keep in mind, being in any one position for a prolonged period of time, no matter how correct your posture, can still lead to stiffness.  WALKING  Walk with an upright posture. Your ears, shoulders and hips should all line-up.  OFFICE WORK  When working at a desk, create an environment that supports good, upright posture. Without extra support, muscles fatigue and lead to excessive strain on joints and other tissues.  CHAIR:  · A chair should be able to slide under your desk when your back makes contact with the back of the chair. This allows you to work closely.  · The chair's height should allow your eyes to be level with the upper part of your monitor and your hands to be slightly lower than your elbows.  · Body position:  · Your feet should make contact with the floor. If this is  not possible, use a foot rest.  · Keep your ears over your shoulders. This will reduce stress on your neck and low back.  Document Released: 08/27/2005 Document Revised: 12/22/2012 Document Reviewed: 12/09/2008  [employer]® Patient Information ©2014 [employer].

## 2014-02-22 ENCOUNTER — Ambulatory Visit (INDEPENDENT_AMBULATORY_CARE_PROVIDER_SITE_OTHER): Payer: 59 | Admitting: Family Medicine

## 2014-02-22 ENCOUNTER — Encounter: Payer: Self-pay | Admitting: Family Medicine

## 2014-02-22 VITALS — BP 149/78 | HR 69

## 2014-02-22 DIAGNOSIS — E119 Type 2 diabetes mellitus without complications: Secondary | ICD-10-CM

## 2014-02-22 DIAGNOSIS — I10 Essential (primary) hypertension: Secondary | ICD-10-CM

## 2014-02-22 DIAGNOSIS — E663 Overweight: Secondary | ICD-10-CM

## 2014-02-22 DIAGNOSIS — N760 Acute vaginitis: Secondary | ICD-10-CM

## 2014-02-22 LAB — POCT URINALYSIS DIPSTICK
BILIRUBIN UA: NEGATIVE
GLUCOSE UA: NEGATIVE
KETONES UA: NEGATIVE
NITRITE UA: NEGATIVE
PH UA: 6.5
Spec Grav, UA: 1.015
Urobilinogen, UA: 0.2

## 2014-02-22 LAB — POCT UA - MICROALBUMIN
Creatinine, POC: 100 mg/dL
Microalbumin Ur, POC: 80 mg/L

## 2014-02-22 MED ORDER — METFORMIN HCL ER 500 MG PO TB24: 500.0000 mg | ORAL_TABLET | Freq: Every day | ORAL | Status: DC

## 2014-02-22 MED ORDER — OLMESARTAN-AMLODIPINE-HCTZ 40-5-25 MG PO TABS: 1.0000 | ORAL_TABLET | Freq: Every day | ORAL | Status: DC

## 2014-02-22 MED ORDER — FLUCONAZOLE 150 MG PO TABS: 150.0000 mg | ORAL_TABLET | Freq: Every day | ORAL | Status: DC

## 2014-02-22 NOTE — Progress Notes (Signed)
Subjective:    Patient ID: Sabrina SpannerMichelle Yvette Dougherty, female    DOB: 08/21/1961, 53 y.o.   MRN: [id number]  HPI Diabetes - no hypoglycemic events. No wounds or sores that are not healing well. No increased thirst or urination. Checking glucose at home. Taking medications as prescribed without any side effects. ldays due for eye exam in August.   Says was unable to get her metformin.  They said someone had already picked it up and couldn't get it.  Plans on getting back with her exercise and diet.    Hypertension- Pt denies chest pain, SOB, dizziness, or heart palpitations.  Taking meds as directed w/o problems.  Denies medication side effects.  Doing well on Tribenzor. Would like to have a sample.   She has had some irritation and itching vaginally. Has been on doxycycline with her dentist.  She does get yeast infections frequently especially when her diabetes is not well controlled.  Never heard back on her mammogram.   She is frustrated with her recent weight gain. The she has not been exercising or dieting. She plans to get back on track. Back she actually started to do Weight Watchers but actually gained about 3 or 4 pounds. They told her that she was actually not getting enough calories and not why she gained. She plans on stopping this and going back to her previous regimen.  Review of Systems  BP 149/78  Pulse 69    Allergies  Allergen Reactions  . Ace Inhibitors   . Amoxicillin-Pot Clavulanate     REACTION: skin peeling  . Propoxyphene Hcl     Past Medical History  Diagnosis Date  . Diabetes mellitus   . Stroke 1990s    left middle cerebral artery stroke  . Stroke 02/2012    left temporal infarct    No past surgical history on file.  History   Social History  . Marital Status: Married    Spouse Name: N/A    Number of Children: N/A  . Years of Education: N/A   Occupational History  . Not on file.   Social History Main Topics  . Smoking status: Current Every Day  Smoker -- 0.10 packs/day    Types: Cigarettes    Last Attempt to Quit: 01/13/2012  . Smokeless tobacco: Not on file  . Alcohol Use: 1.5 oz/week    3 drink(s) per week  . Drug Use: No  . Sexual Activity: Yes   Other Topics Concern  . Not on file   Social History Narrative  . No narrative on file    Family History  Problem Relation Age of Onset  . Alcohol abuse Mother   . Diabetes Sister     Outpatient Encounter Prescriptions as of 02/22/2014  Medication Sig  . AMBULATORY NON FORMULARY MEDICATION Medication Name: Freestyle Promise Insulin X strips; glucometer checks BID  . aspirin 81 MG tablet Take 81 mg by mouth daily.  . cyclobenzaprine (FLEXERIL) 10 MG tablet Take 1 tablet (10 mg total) by mouth 3 (three) times daily as needed for muscle spasms.  Marland Kitchen. ibuprofen (ADVIL,MOTRIN) 800 MG tablet Take 1 tablet (800 mg total) by mouth every 8 (eight) hours as needed.  . lactulose, encephalopathy, (CHRONULAC) 10 GM/15ML SOLN Take 30 g by mouth as needed.  . Olmesartan-Amlodipine-HCTZ (TRIBENZOR) 40-5-25 MG TABS Take 1 tablet by mouth daily.  . OMEGA 3 1000 MG CAPS Take by mouth.    . traMADol (ULTRAM) 50 MG tablet 1 tablet by mouth  as needed for next pain up to three times a day.  . [DISCONTINUED] Olmesartan-Amlodipine-HCTZ (TRIBENZOR) 40-5-25 MG TABS Take 1 tablet by mouth daily.  . [DISCONTINUED] Olmesartan-Amlodipine-HCTZ (TRIBENZOR) 40-5-25 MG TABS Take 1 tablet by mouth daily.  . fluconazole (DIFLUCAN) 150 MG tablet Take 1 tablet (150 mg total) by mouth daily.  . metFORMIN (GLUCOPHAGE-XR) 500 MG 24 hr tablet Take 1 tablet (500 mg total) by mouth daily with breakfast.  . [DISCONTINUED] metFORMIN (GLUCOPHAGE-XR) 500 MG 24 hr tablet Take 1 tablet (500 mg total) by mouth daily with breakfast.  . [DISCONTINUED] metFORMIN (GLUCOPHAGE-XR) 500 MG 24 hr tablet Take 1 tablet (500 mg total) by mouth daily with breakfast.          Objective:   Physical Exam  Constitutional: She is oriented  to person, place, and time. She appears well-developed and well-nourished.  HENT:  Head: Normocephalic and atraumatic.  Cardiovascular: Normal rate, regular rhythm and normal heart sounds.   Pulmonary/Chest: Effort normal and breath sounds normal.  Neurological: She is alert and oriented to person, place, and time.  Skin: Skin is warm and dry.  Psychiatric: She has a normal mood and affect. Her behavior is normal.          Assessment & Plan:  DM- Unctontrolled.  Discussed the importance of getting on the metformin. Will also help her lose weight which is very important to getting her sugars under good control. She requested to be sent to her mail order pharmacy. Monofilament exam performed today. We will try to collect the urine microalbumin. We'll need to call to get most up-to-date eye exam. Lab Results  Component Value Date   HGBA1C 8.5 08/24/2013    HTN - Uncontrolled. Says taking her meds and plans to get back on track with diet and exercise. For now we'll continue current regimen and see her back in 3 months. If blood pressures not well regulated at that time then we will adjust her regimen.  Vaginitis - treat with Diflucan. She's not improving then we'll need to come back in for wet prep.  Weight gain-discussed the importance of getting back on track. This makes a big difference in her diabetes and her blood pressure.  Patient refused to weigh today.

## 2014-04-12 ENCOUNTER — Other Ambulatory Visit: Payer: Self-pay | Admitting: Family Medicine

## 2014-05-31 ENCOUNTER — Ambulatory Visit: Payer: 59 | Admitting: Family Medicine

## 2014-05-31 ENCOUNTER — Telehealth: Payer: Self-pay | Admitting: Family Medicine

## 2014-05-31 NOTE — Telephone Encounter (Signed)
Patient has been deemed a "no-show" for today's scheduled appointment.  Based on chief complaint and my chart review:   -Follow-up necessary.  Contacting patient and urged to schedule visit in 2-14 days. .  If necessary this was communicated to the patient via phone or mail.

## 2014-06-03 ENCOUNTER — Encounter: Payer: Self-pay | Admitting: Family Medicine

## 2014-06-03 ENCOUNTER — Ambulatory Visit (INDEPENDENT_AMBULATORY_CARE_PROVIDER_SITE_OTHER): Payer: 59 | Admitting: Family Medicine

## 2014-06-03 VITALS — BP 151/84 | HR 90 | Temp 97.6°F

## 2014-06-03 DIAGNOSIS — Z23 Encounter for immunization: Secondary | ICD-10-CM

## 2014-06-03 DIAGNOSIS — I10 Essential (primary) hypertension: Secondary | ICD-10-CM

## 2014-06-03 DIAGNOSIS — Z1231 Encounter for screening mammogram for malignant neoplasm of breast: Secondary | ICD-10-CM

## 2014-06-03 DIAGNOSIS — E119 Type 2 diabetes mellitus without complications: Secondary | ICD-10-CM

## 2014-06-03 LAB — POCT UA - MICROALBUMIN
CREATININE, POC: 100 mg/dL
MICROALBUMIN (UR) POC: 150 mg/L

## 2014-06-03 LAB — POCT GLYCOSYLATED HEMOGLOBIN (HGB A1C): HEMOGLOBIN A1C: 9.9

## 2014-06-03 MED ORDER — METFORMIN HCL ER 500 MG PO TB24: ORAL_TABLET | ORAL | Status: DC

## 2014-06-03 MED ORDER — OLMESARTAN-AMLODIPINE-HCTZ 40-5-25 MG PO TABS: ORAL_TABLET | ORAL | Status: DC

## 2014-06-03 MED ORDER — OLMESARTAN-AMLODIPINE-HCTZ 40-5-25 MG PO TABS: 1.0000 | ORAL_TABLET | Freq: Every day | ORAL | Status: DC

## 2014-06-03 MED ORDER — CANAGLIFLOZIN 100 MG PO TABS: 1.0000 | ORAL_TABLET | Freq: Every day | ORAL | Status: DC

## 2014-06-03 NOTE — Assessment & Plan Note (Signed)
Uncontrolled. Will start Invokana.  Discussed risk and benefits of medication and potential S.E. Including UTI and yeast infection. F/U in 3 months. Also discussed getting back on track with diet and exercise.

## 2014-06-03 NOTE — Progress Notes (Signed)
   Subjective:    Patient ID: Sabrina Dougherty, female    DOB: 02-25-1961, 53 y.o.   MRN: [id number]  Diabetes   Diabetes - no hypoglycemic events. No wounds or sores that are not healing well. No increased thirst or urination. Not hecking glucose at home. Taking medications as prescribed without any side effects. Says really hasn't been doing what she needs to be doing.  Has been traveling a lot back and forth to [place] as her ex husband recently died from cancer.    Hypertension- Pt denies chest pain, SOB, dizziness, or heart palpitations.  Taking meds as directed w/o problems.  Denies medication side effects.  Says forgot to tak her medications today.   Review of Systems     Objective:   Physical Exam  Constitutional: She is oriented to person, place, and time. She appears well-developed and well-nourished.  HENT:  Head: Normocephalic and atraumatic.  Cardiovascular: Normal rate, regular rhythm and normal heart sounds.   Pulmonary/Chest: Effort normal and breath sounds normal.  Neurological: She is alert and oriented to person, place, and time.  Skin: Skin is warm and dry.  Psychiatric: She has a normal mood and affect. Her behavior is normal.          Assessment & Plan:

## 2014-06-03 NOTE — Assessment & Plan Note (Signed)
Uncontrolled today as didn't take her meds.  Make sure taking regualry. F/U in 3 mo.

## 2014-06-10 NOTE — Telephone Encounter (Signed)
Left vm for pt to call to reschedule appt.

## 2014-06-15 NOTE — Telephone Encounter (Signed)
Pt already seen

## 2014-08-27 ENCOUNTER — Telehealth: Payer: Self-pay

## 2014-08-27 ENCOUNTER — Other Ambulatory Visit: Payer: Self-pay | Admitting: Family Medicine

## 2014-08-27 DIAGNOSIS — Z1239 Encounter for other screening for malignant neoplasm of breast: Secondary | ICD-10-CM

## 2014-08-27 DIAGNOSIS — R928 Other abnormal and inconclusive findings on diagnostic imaging of breast: Secondary | ICD-10-CM

## 2014-08-27 NOTE — Telephone Encounter (Signed)
Marcelino DusterMichelle needs an order for a Diagnostic Bilateral Mammogram faxed to [facility].   In Dr Shelah LewandowskyMetheney's box to be signed. Please fax to [phone]

## 2014-09-06 ENCOUNTER — Other Ambulatory Visit: Payer: Self-pay | Admitting: *Deleted

## 2014-09-06 ENCOUNTER — Telehealth: Payer: Self-pay | Admitting: *Deleted

## 2014-09-06 DIAGNOSIS — Z1239 Encounter for other screening for malignant neoplasm of breast: Secondary | ICD-10-CM

## 2014-09-06 DIAGNOSIS — R928 Other abnormal and inconclusive findings on diagnostic imaging of breast: Secondary | ICD-10-CM

## 2014-09-06 NOTE — Telephone Encounter (Signed)
Order faxed.Garnetta Fedrick Lynetta  

## 2014-09-27 ENCOUNTER — Encounter: Payer: Self-pay | Admitting: Family Medicine

## 2014-10-14 ENCOUNTER — Other Ambulatory Visit: Payer: Self-pay

## 2014-10-14 MED ORDER — OLMESARTAN-AMLODIPINE-HCTZ 40-5-25 MG PO TABS: ORAL_TABLET | ORAL | Status: DC

## 2014-11-02 ENCOUNTER — Other Ambulatory Visit (HOSPITAL_COMMUNITY)
Admission: RE | Admit: 2014-11-02 | Discharge: 2014-11-02 | Disposition: A | Discharge status: Home / Self Care | Payer: 59 | Source: Ambulatory Visit | Attending: Family Medicine | Admitting: Family Medicine

## 2014-11-02 ENCOUNTER — Encounter: Payer: Self-pay | Admitting: Family Medicine

## 2014-11-02 ENCOUNTER — Ambulatory Visit (INDEPENDENT_AMBULATORY_CARE_PROVIDER_SITE_OTHER): Payer: 59 | Admitting: Family Medicine

## 2014-11-02 VITALS — BP 138/79 | HR 75 | Wt 159.0 lb

## 2014-11-02 DIAGNOSIS — Z01419 Encounter for gynecological examination (general) (routine) without abnormal findings: Secondary | ICD-10-CM | POA: Insufficient documentation

## 2014-11-02 DIAGNOSIS — Z13220 Encounter for screening for lipoid disorders: Secondary | ICD-10-CM

## 2014-11-02 DIAGNOSIS — Z720 Tobacco use: Secondary | ICD-10-CM

## 2014-11-02 DIAGNOSIS — Z124 Encounter for screening for malignant neoplasm of cervix: Secondary | ICD-10-CM

## 2014-11-02 DIAGNOSIS — Z1151 Encounter for screening for human papillomavirus (HPV): Secondary | ICD-10-CM | POA: Diagnosis present

## 2014-11-02 DIAGNOSIS — I10 Essential (primary) hypertension: Secondary | ICD-10-CM

## 2014-11-02 DIAGNOSIS — Z1211 Encounter for screening for malignant neoplasm of colon: Secondary | ICD-10-CM

## 2014-11-02 DIAGNOSIS — F17200 Nicotine dependence, unspecified, uncomplicated: Secondary | ICD-10-CM

## 2014-11-02 DIAGNOSIS — E119 Type 2 diabetes mellitus without complications: Secondary | ICD-10-CM

## 2014-11-02 LAB — POCT GLYCOSYLATED HEMOGLOBIN (HGB A1C): HEMOGLOBIN A1C: 8.8

## 2014-11-02 MED ORDER — GLIPIZIDE 10 MG PO TABS: 10.0000 mg | ORAL_TABLET | Freq: Two times a day (BID) | ORAL | Status: DC

## 2014-11-02 MED ORDER — METFORMIN HCL ER 500 MG PO TB24: ORAL_TABLET | ORAL | Status: DC

## 2014-11-02 NOTE — Progress Notes (Signed)
   Subjective:    Patient ID: Sabrina Dougherty, female    DOB: 03/04/1961, 54 y.o.   MRN: [id number]  HPI Diabetes - no hypoglycemic events. No wounds or sores that are not healing well. No increased thirst or urination. Checking glucose at home. Taking medications as prescribed without any side effects. She did pick up the perception for the Invokana but saw a contra indication of people with a prior history of pancreatitis. She says she's actually had 3 episodes of pancreatitis and really wants to avoid any medication that may put her at increased risk of having a recurrent episode. She is concerned because she feels like when she takes her metformin it actually raises her blood sugars. She skips her medicine completely when her sugars are normal. She says that she did not do well for December and January. In fact she said she really much 8 which she wanted today. She did a little bit better in February except for around Valentine's Day.  Also request pap smear screening-she denies any pain or abnormal bleeding etc.  Hypertension- Pt denies chest pain, SOB, dizziness, or heart palpitations.  Taking meds as directed w/o problems.  Denies medication side effects.     Review of Systems     Objective:   Physical Exam  Constitutional: She is oriented to person, place, and time. She appears well-developed and well-nourished.  HENT:  Head: Normocephalic and atraumatic.  Cardiovascular: Normal rate, regular rhythm and normal heart sounds.   Pulmonary/Chest: Effort normal and breath sounds normal.  Neurological: She is alert and oriented to person, place, and time.  Skin: Skin is warm and dry.  Psychiatric: She has a normal mood and affect. Her behavior is normal.          Assessment & Plan:  DM- She really feels very strongly the metformin was causing an elevation in her blood sugar. She was taking it erratically and alternating between her metformin and her insulin instead of adding  the insulin to her metformin when her sugars were high. We discussed taking the metformin every day even if her blood sugars are normal and then using the insulin as needed with her sugars are high. Did go ahead and remove Invokana from her medication list. We will also need to stay away from the DPP 4 hours as well with her history pink otitis. Could certainly consider glipizide for sugars are not under good control. Follow-up in 3 months. Will refer to optometry for eye evaluation.  HTN - mildly elevated today. Not maximally controlled.  Cervical Cancer screening - Pap smear performed today. We'll call results once available.  Due to recheck lipids. She really needs to be on a statin with her diagnosis of diabetes.  Smoking - encouraged cessation.   Also encouraged colon cancer screening. Will place referral to GI.

## 2014-11-02 NOTE — Patient Instructions (Signed)
Please make sure taking the metformin every single day. And then used her insulin as needed.

## 2014-11-03 LAB — CYTOLOGY - PAP

## 2014-11-04 NOTE — Progress Notes (Signed)
Quick Note:  Call patient: Your Pap smear is normal. Repeat in 5 years. ______ 

## 2014-11-05 MED ORDER — FENTANYL CITRATE 0.05 MG/ML IJ SOLN
INTRAMUSCULAR | Status: AC
  Filled 2014-11-05: qty 5

## 2014-11-05 MED ORDER — MIDAZOLAM HCL 2 MG/2ML IJ SOLN
INTRAMUSCULAR | Status: AC
  Filled 2014-11-05: qty 2

## 2015-01-12 ENCOUNTER — Other Ambulatory Visit: Payer: Self-pay | Admitting: *Deleted

## 2015-01-12 MED ORDER — OLMESARTAN-AMLODIPINE-HCTZ 40-5-25 MG PO TABS: ORAL_TABLET | ORAL | Status: DC

## 2015-01-12 MED ORDER — METFORMIN HCL ER 500 MG PO TB24: ORAL_TABLET | ORAL | Status: DC

## 2015-01-31 ENCOUNTER — Ambulatory Visit: Payer: 59 | Admitting: Family Medicine

## 2015-02-11 ENCOUNTER — Ambulatory Visit: Payer: 59 | Admitting: Family Medicine

## 2015-06-20 ENCOUNTER — Ambulatory Visit (INDEPENDENT_AMBULATORY_CARE_PROVIDER_SITE_OTHER): Payer: 59 | Admitting: Family Medicine

## 2015-06-20 ENCOUNTER — Encounter: Payer: Self-pay | Admitting: Family Medicine

## 2015-06-20 VITALS — BP 126/56 | HR 105 | Temp 98.7°F | Wt 169.0 lb

## 2015-06-20 DIAGNOSIS — I10 Essential (primary) hypertension: Secondary | ICD-10-CM

## 2015-06-20 DIAGNOSIS — A09 Infectious gastroenteritis and colitis, unspecified: Secondary | ICD-10-CM

## 2015-06-20 DIAGNOSIS — E119 Type 2 diabetes mellitus without complications: Secondary | ICD-10-CM

## 2015-06-20 DIAGNOSIS — Z114 Encounter for screening for human immunodeficiency virus [HIV]: Secondary | ICD-10-CM | POA: Diagnosis not present

## 2015-06-20 DIAGNOSIS — Z1159 Encounter for screening for other viral diseases: Secondary | ICD-10-CM | POA: Diagnosis not present

## 2015-06-20 LAB — POCT UA - MICROALBUMIN
Albumin/Creatinine Ratio, Urine, POC: >300
Creatinine, POC: 200 mg/dL
Microalbumin Ur, POC: 150 mg/L

## 2015-06-20 LAB — POCT GLYCOSYLATED HEMOGLOBIN (HGB A1C): Hemoglobin A1C: 8.8

## 2015-06-20 MED ORDER — INSULIN GLARGINE 100 UNIT/ML ~~LOC~~ SOLN
45.0000 [IU] | Freq: Every day | SUBCUTANEOUS | Status: DC
Start: 2015-06-20 — End: 2016-07-11

## 2015-06-20 MED ORDER — PROMETHAZINE HCL 25 MG PO TABS: 25.0000 mg | ORAL_TABLET | Freq: Three times a day (TID) | ORAL | Status: DC | PRN

## 2015-06-20 MED ORDER — METFORMIN HCL ER 500 MG PO TB24: ORAL_TABLET | ORAL | Status: DC

## 2015-06-20 MED ORDER — OLMESARTAN-AMLODIPINE-HCTZ 40-5-25 MG PO TABS: ORAL_TABLET | ORAL | Status: DC

## 2015-06-20 NOTE — Progress Notes (Signed)
   Subjective:    Patient ID: Sabrina Dougherty, female    DOB: Apr 29, 1961, 54 y.o.   MRN: [id number]  HPI Diabetes - no hypoglycemic events. No wounds or sores that are not healing well. No increased thirst or urination. Checking glucose at home. Taking medications as prescribed without any side effects.  Hypertension- Pt denies chest pain, SOB, dizziness, or heart palpitations.  Taking meds as directed w/o problems.  Denies medication side effects.    About 5 days ago started having menstrual cramps and then started having diarrhea. She felt achey and had a hard time getting out of bed.  Fever over the weekend.  Has had about 4-5 BMs today. Vomited today. Daughter had diarrhea for about week, about 2 weeks ago. Getting sharp abdominal painb.  No recent antibiotic use.  Review of Systems     Objective:   Physical Exam  Constitutional: She is oriented to person, place, and time. She appears well-developed and well-nourished.  HENT:  Head: Normocephalic and atraumatic.  Cardiovascular: Normal rate, regular rhythm and normal heart sounds.   Pulmonary/Chest: Effort normal and breath sounds normal.  Abdominal: Soft. Bowel sounds are normal. She exhibits distension. She exhibits no mass. There is tenderness. There is no rebound and no guarding.  Diffuse tenderness but worse in the epigastric area.  Neurological: She is alert and oriented to person, place, and time.  Skin: Skin is warm and dry.  Psychiatric: She has a normal mood and affect. Her behavior is normal.          Assessment & Plan:  DM- uncontrolled. Increase Lantus to 45 units that she admits she hasn't been giving herself regularly. Encouraged her to be consistent as possible. Follow-up in 3 months.  HTN - well controlled  Diarrhea - likely viral. Recommend conservative care. Finnegan's to pharmacy for nausea and vomiting. Make sure staying well hydrated. Call if pain worsens or develops fever.

## 2015-06-20 NOTE — Patient Instructions (Signed)

## 2015-06-22 DIAGNOSIS — E118 Type 2 diabetes mellitus with unspecified complications: Secondary | ICD-10-CM | POA: Insufficient documentation

## 2015-06-22 DIAGNOSIS — R471 Dysarthria and anarthria: Secondary | ICD-10-CM | POA: Insufficient documentation

## 2015-06-22 DIAGNOSIS — I6930 Unspecified sequelae of cerebral infarction: Secondary | ICD-10-CM | POA: Insufficient documentation

## 2015-06-22 DIAGNOSIS — N10 Acute tubulo-interstitial nephritis: Secondary | ICD-10-CM | POA: Insufficient documentation

## 2015-06-22 DIAGNOSIS — D72829 Elevated white blood cell count, unspecified: Secondary | ICD-10-CM | POA: Insufficient documentation

## 2015-06-23 DIAGNOSIS — R4701 Aphasia: Secondary | ICD-10-CM | POA: Insufficient documentation

## 2015-06-28 DIAGNOSIS — A4151 Sepsis due to Escherichia coli [E. coli]: Secondary | ICD-10-CM | POA: Insufficient documentation

## 2015-07-01 LAB — HM DIABETES EYE EXAM

## 2015-07-04 ENCOUNTER — Encounter: Payer: Self-pay | Admitting: Family Medicine

## 2015-07-04 ENCOUNTER — Telehealth: Payer: Self-pay

## 2015-07-04 ENCOUNTER — Ambulatory Visit (INDEPENDENT_AMBULATORY_CARE_PROVIDER_SITE_OTHER): Payer: 59 | Admitting: Family Medicine

## 2015-07-04 VITALS — BP 90/63 | HR 90 | Temp 98.0°F | Resp 18 | Wt 169.0 lb

## 2015-07-04 DIAGNOSIS — I10 Essential (primary) hypertension: Secondary | ICD-10-CM

## 2015-07-04 DIAGNOSIS — N179 Acute kidney failure, unspecified: Secondary | ICD-10-CM

## 2015-07-04 DIAGNOSIS — A4151 Sepsis due to Escherichia coli [E. coli]: Secondary | ICD-10-CM

## 2015-07-04 DIAGNOSIS — E118 Type 2 diabetes mellitus with unspecified complications: Secondary | ICD-10-CM

## 2015-07-04 DIAGNOSIS — Z794 Long term (current) use of insulin: Secondary | ICD-10-CM

## 2015-07-04 DIAGNOSIS — I639 Cerebral infarction, unspecified: Secondary | ICD-10-CM | POA: Diagnosis not present

## 2015-07-04 MED ORDER — AMBULATORY NON FORMULARY MEDICATION: Status: DC

## 2015-07-04 NOTE — Progress Notes (Signed)
Subjective:    Patient ID: Sabrina Dougherty, female    DOB: December 04, 1960, 54 y.o.   MRN: [id number]  HPI 6 you female with diabetes who is here today for hospital follow-up from [facility] which is [facility].. She was diagnosed with severe sepsis with multiple acute stroke with the left MCA territory. Started  On Plavix. She had and echo and carotid dopplers. She is getting home OT and PT.  They came out today.  She has had significant experession and receptive language dysfunction.  She gets frustrated.  Her husband feels it is getting better.  Urine e coli.  She also had acute renal failure and they held her ARB and HCTZ.  Unfortunately I could not find a discharge summary in care everywhere but piece together what I could from the history. He says she was also diagnosed with C. difficile and started on vancomycin which she is still currently taking.  Head CT showed : Significant progression of size of left middle cerebral artery  distribution infarct involving portions of the left temporal lobe,  left subinsular region, left peri operculum region and posterior  left frontal lobe. No hemorrhagic conversion noted. No significant  associated mass effect.  Diabetes-her insulin was held because of hypoglycemia upon admission. She was discharged home on her metformin and told to follow-up. She's eating a little bit more normally since she has been home.  Review of Systems     Objective:   Physical Exam  Constitutional: She is oriented to person, place, and time. She appears well-developed and well-nourished.  HENT:  Head: Normocephalic and atraumatic.  Right Ear: External ear normal.  Left Ear: External ear normal.  Nose: Nose normal.  Eyes: Conjunctivae and EOM are normal. Pupils are equal, round, and reactive to light.  Neck: Neck supple. No thyromegaly present.  Cardiovascular: Normal rate, regular rhythm and normal heart sounds.    Pulmonary/Chest: Effort normal and breath sounds normal. She has no wheezes.  Abdominal: Soft. Bowel sounds are normal. She exhibits distension. She exhibits no mass. There is tenderness. There is no rebound and no guarding.  + inc tympany.  Tender to palpation in the Right side of abdomen.    Lymphadenopathy:    She has no cervical adenopathy.  Neurological: She is alert and oriented to person, place, and time.  Skin: Skin is warm and dry.  Psychiatric: She has a normal mood and affect.        Assessment & Plan:  Severe sepsis-urine culture was positive for Escherichia coli and it sounds like she's also being treated for C. difficile with vancomycin. She is doing better since she's been home. She says she's been eating and drinking normally. We did recommend repeating a urinalysis but she actually left [place] a stool sample instead of a urine sample. But it was somewhat formed stool.  Left MCA territory stroke-she has been left some with some residual expressive and receptive language dysfunction. She was able to indicate fairly well here in the office today but wasn't speaking in complete sentences. She is on Plavix. Continue statin.  Acute kidney injury-we'll check BMP to make sure that kidney function is back to baseline.  Diabetes-she was hypoglycemic upon admission and she had not been eating and feeling well for several days. She is now just on metformin at home blood sugars have been normal the last couple days. We'll continue to keep an eye on this as we may need to restart insulin  she is eating more normally. Right now we are holding it.  Hypertension-her diuretic was held because of renal function and she was discharged home on amlodipine. A recommended that we restart losartan once her kidney function is improving. We'll call with results once available. Continue to hold the HCTZ. She was previously on Tribenzor.

## 2015-07-04 NOTE — Telephone Encounter (Signed)
Error

## 2015-07-05 ENCOUNTER — Encounter: Payer: Self-pay | Admitting: Family Medicine

## 2015-07-05 LAB — CBC AND DIFFERENTIAL
HCT: 29 % — AB (ref 36–46)
HEMOGLOBIN: 9.2 g/dL — AB (ref 12.0–16.0)
Neutrophils Absolute: 6 /uL
PLATELETS: 675 10*3/uL — AB (ref 150–399)
WBC: 8.9 10^3/mL

## 2015-07-05 LAB — BASIC METABOLIC PANEL
BUN: 12 mg/dL (ref 4–21)
CREATININE: 1.1 mg/dL (ref 0.5–1.1)
Glucose: 203 mg/dL

## 2015-07-06 ENCOUNTER — Telehealth: Payer: Self-pay | Admitting: Family Medicine

## 2015-07-06 NOTE — Telephone Encounter (Signed)
Please call patient: Hemoglobin is still little low at 9.2. White blood cells are down to normal just reassuring that the infection is clearing up. Creatinine was back down to 1.1 which is fantastic. Please make sure she has a follow-up in the next 2-4 weeks.

## 2015-07-06 NOTE — Telephone Encounter (Signed)
Informed husband of results and verified understanding. Pt has a f/u 07/25/15

## 2015-07-11 ENCOUNTER — Telehealth: Payer: Self-pay

## 2015-07-11 MED ORDER — FLUCONAZOLE 150 MG PO TABS
150.0000 mg | ORAL_TABLET | Freq: Once | ORAL | Status: DC
Start: 2015-07-11 — End: 2015-07-25

## 2015-07-11 NOTE — Telephone Encounter (Signed)
Left message advising of recommendations.  

## 2015-07-11 NOTE — Telephone Encounter (Signed)
[name] from [facility] called and reports Sabrina Dougherty was in the hospital and treated with an antibiotic. She now has a yeast infection. She would like something called in. Please advise.   [phone]

## 2015-07-11 NOTE — Telephone Encounter (Signed)
Ok for diflucan. Script sent to [facility].

## 2015-07-12 MED ORDER — METRONIDAZOLE 500 MG PO TABS: 500.0000 mg | ORAL_TABLET | Freq: Three times a day (TID) | ORAL | Status: DC

## 2015-07-12 NOTE — Telephone Encounter (Signed)
Patient was to take vancomycin for 17 days. She has only taken it for 9 days. Should she continue or stop?   [facility]. [phone]

## 2015-07-12 NOTE — Telephone Encounter (Signed)
She can stop the vanc and start metronidazole. New Rx sent to the pharmacy.  Call if diarrhea not improving by Monday.

## 2015-07-12 NOTE — Telephone Encounter (Signed)
Left a message advising or recommendations.  

## 2015-07-20 ENCOUNTER — Encounter: Payer: Self-pay | Admitting: Emergency Medicine

## 2015-07-25 ENCOUNTER — Encounter: Payer: Self-pay | Admitting: Family Medicine

## 2015-07-25 ENCOUNTER — Ambulatory Visit (INDEPENDENT_AMBULATORY_CARE_PROVIDER_SITE_OTHER): Payer: 59 | Admitting: Family Medicine

## 2015-07-25 VITALS — BP 142/76 | HR 72 | Temp 98.0°F | Resp 18 | Wt 158.2 lb

## 2015-07-25 DIAGNOSIS — A047 Enterocolitis due to Clostridium difficile: Secondary | ICD-10-CM | POA: Diagnosis not present

## 2015-07-25 DIAGNOSIS — E119 Type 2 diabetes mellitus without complications: Secondary | ICD-10-CM | POA: Diagnosis not present

## 2015-07-25 DIAGNOSIS — I10 Essential (primary) hypertension: Secondary | ICD-10-CM

## 2015-07-25 DIAGNOSIS — R21 Rash and other nonspecific skin eruption: Secondary | ICD-10-CM | POA: Diagnosis not present

## 2015-07-25 DIAGNOSIS — I63512 Cerebral infarction due to unspecified occlusion or stenosis of left middle cerebral artery: Secondary | ICD-10-CM

## 2015-07-25 DIAGNOSIS — A0472 Enterocolitis due to Clostridium difficile, not specified as recurrent: Secondary | ICD-10-CM

## 2015-07-25 MED ORDER — TRIAMCINOLONE ACETONIDE 0.5 % EX OINT: 1.0000 "application " | TOPICAL_OINTMENT | Freq: Every day | CUTANEOUS | Status: DC | PRN

## 2015-07-25 NOTE — Progress Notes (Signed)
   Subjective:    Patient ID: Sabrina Dougherty, female    DOB: 04/03/1961, 54 y.o.   MRN: [id number]  HPI C diff colitis - she is no longer having diarrhea.  Says in fact hasn't had a BM in about 3 days.  She never took the metronidazole. Infectious she brought in the entire bottle today.  DM- No hypoglycemic events.  She is out of her insulin. According to our records we sent a refill to her mail order in October. Her husband says that he thinks something may have come in while she was in the skilled nursing facilities available double check at home and see if she really does have her insulin. She does not and please give us a call back so that we can call the mail order, [facility].  HTN - says that the home health nurse came a few days ago and checked her blood pressure. It was 120/80. I asked if she refills on the amlodipine as this was written by the hospital but she's not sure if we did check on this at home.  RAsh on left lower abdomen. Started since was in the hospital. Has been itchey.    follow-up stroke,  Left MCA territory- she does feel like her speech as she has been getting a little bit better but not completely back to normal.  Review of Systems     Objective:   Physical Exam  Constitutional: She is oriented to person, place, and time. She appears well-developed and well-nourished.  HENT:  Head: Normocephalic and atraumatic.  Cardiovascular: Normal rate, regular rhythm and normal heart sounds.   Pulmonary/Chest: Effort normal and breath sounds normal.  Neurological: She is alert and oriented to person, place, and time.  Skin: Skin is warm and dry. Rash noted.  Dry scaley rash on left lower abdomen with bruising.   Psychiatric: She has a normal mood and affect. Her behavior is normal.          Assessment & Plan:  C. difficile colitis-her diarrhea has resolved. She has now been a little bit constipated the last few days. Okay to start taking a stool  softener.  DM- According to our records we sent a refill to her mail order in October. Her husband says that he thinks something may have come in while she was in the skilled nursing facilities available double check at home and see if she really does have her insulin. She does not and please give us a call back so that we can call the mail order, [facility].  HTN -  Repeat blood pressure was still elevated here. It sounds like se has got some good blood pressures t home with the nurse. I want her to double check a home to make sure that she really is taking the amlodipine. She was previously on Tribenzor but because of acute renal issues it was discontinued.  RAsh - looks consistent with eczema/dermatitis-will treat with topical steroid cream. Call if not better in 2 weeks.   Recent stroke-she is still having symptoms were finding difficulty with her speech.

## 2015-07-28 ENCOUNTER — Telehealth: Payer: Self-pay | Admitting: *Deleted

## 2015-07-28 NOTE — Telephone Encounter (Signed)
Gave VO for continued speech therapy for 2 times a week for 2 more weeks.Heath GoldBarkley, Avalyn Molino Lynetta'

## 2015-08-29 ENCOUNTER — Other Ambulatory Visit: Payer: Self-pay | Admitting: Family Medicine

## 2015-08-29 ENCOUNTER — Other Ambulatory Visit: Payer: Self-pay

## 2015-08-29 MED ORDER — AMLODIPINE BESYLATE 5 MG PO TABS: 5.0000 mg | ORAL_TABLET | Freq: Every day | ORAL | Status: DC

## 2015-08-29 MED ORDER — CLOPIDOGREL BISULFATE 75 MG PO TABS: 75.0000 mg | ORAL_TABLET | Freq: Every day | ORAL | Status: DC

## 2015-08-30 ENCOUNTER — Telehealth: Payer: Self-pay

## 2015-08-30 NOTE — Telephone Encounter (Signed)
Ok for verbal order  °

## 2015-08-30 NOTE — Telephone Encounter (Signed)
Raynelle [facility]Julie from [facility] needs an order for speech therapy.  Please fax to [phone].

## 2015-08-31 MED ORDER — AMBULATORY NON FORMULARY MEDICATION: Status: DC

## 2015-08-31 NOTE — Telephone Encounter (Signed)
Therapy ordered, and Faxed

## 2015-09-01 ENCOUNTER — Other Ambulatory Visit: Payer: Self-pay | Admitting: Family Medicine

## 2015-09-01 MED ORDER — CLOPIDOGREL BISULFATE 75 MG PO TABS: 75.0000 mg | ORAL_TABLET | Freq: Every day | ORAL | Status: DC

## 2015-09-01 MED ORDER — AMLODIPINE BESYLATE 5 MG PO TABS: 5.0000 mg | ORAL_TABLET | Freq: Every day | ORAL | Status: DC

## 2015-09-19 ENCOUNTER — Ambulatory Visit: Payer: 59 | Admitting: Family Medicine

## 2015-10-03 ENCOUNTER — Encounter: Payer: Self-pay | Admitting: Family Medicine

## 2015-10-03 ENCOUNTER — Ambulatory Visit (INDEPENDENT_AMBULATORY_CARE_PROVIDER_SITE_OTHER): Payer: 59 | Admitting: Family Medicine

## 2015-10-03 VITALS — BP 162/84 | HR 75 | Wt 155.0 lb

## 2015-10-03 DIAGNOSIS — IMO0001 Reserved for inherently not codable concepts without codable children: Secondary | ICD-10-CM

## 2015-10-03 DIAGNOSIS — M6289 Other specified disorders of muscle: Secondary | ICD-10-CM

## 2015-10-03 DIAGNOSIS — E119 Type 2 diabetes mellitus without complications: Secondary | ICD-10-CM

## 2015-10-03 DIAGNOSIS — Z5189 Encounter for other specified aftercare: Secondary | ICD-10-CM | POA: Diagnosis not present

## 2015-10-03 DIAGNOSIS — R29898 Other symptoms and signs involving the musculoskeletal system: Secondary | ICD-10-CM

## 2015-10-03 DIAGNOSIS — I10 Essential (primary) hypertension: Secondary | ICD-10-CM

## 2015-10-03 LAB — POCT GLYCOSYLATED HEMOGLOBIN (HGB A1C): Hemoglobin A1C: 10.1

## 2015-10-03 MED ORDER — AMLODIPINE BESYLATE 10 MG PO TABS: 10.0000 mg | ORAL_TABLET | Freq: Every day | ORAL | Status: DC

## 2015-10-03 MED ORDER — ATORVASTATIN CALCIUM 80 MG PO TABS: 80.0000 mg | ORAL_TABLET | Freq: Every day | ORAL | Status: DC

## 2015-10-03 NOTE — Progress Notes (Signed)
   Subjective:    Patient ID: Sabrina Dougherty, female    DOB: 11-03-1960, 55 y.o.   MRN: [id number]  HPI Diabetes - no hypoglycemic events. No wounds or sores that are not healing well. No increased thirst or urination. Checking glucose at home. Taking medications as prescribed without any side effects.  She has lost 3 lbs. Started exercising last week. She hsan't been eating the best.  She says when her sugar is low she has been skipping the whole day for her insulin. She is on 45 units of Lantus.   Hypertension- Pt denies chest pain, SOB, dizziness, or heart palpitations.  Taking meds as directed w/o problems.  Denies medication side effects.    Hx of stroke - she has not been taking her statin.  She says she has a hard time taking meds at night. All other meds she takes in AM.  She still having some residual weakness in the right hand. She is having difficulty buttoning her shirt as an example. She's also noticed some occasional swelling. She is right-handed. She is not able to drive or right.  Review of Systems     Objective:   Physical Exam  Constitutional: She is oriented to person, place, and time. She appears well-developed and well-nourished.  HENT:  Head: Normocephalic and atraumatic.  Cardiovascular: Normal rate, regular rhythm and normal heart sounds.   Pulmonary/Chest: Effort normal and breath sounds normal.  Neurological: She is alert and oriented to person, place, and time.  Skin: Skin is warm and dry.  Psychiatric: She has a normal mood and affect. Her behavior is normal.          Assessment & Plan:  DM- uncontrolled. We discussed options of adjusting her medication. She declined and says she wants to work on using the Lantus consistently over the next few months to see if she can get it under better control. She also started back to the gym this week and is hoping that will get her sugars down as well. In the past but has typically made a very big difference in  how well controlled she has.  HTN - uncontrolled. Increase amlodipine to 10 mg. New perception sent to mail order.   Hx of stroke - on aspirin. Monitor to make sure that she is consistently taking the Lipitor regularly. Will make referral for physical therapy for home to work on her right hand fine motor and occupational skills. She is also currently getting speech therapy but her husband has to drive her this is been very difficult so go ahead and place referral for speech therapy as well. She's had speech difficulties and having her stroke.

## 2015-10-03 NOTE — Patient Instructions (Signed)
Use 35 units of Lantus on days when sugar is less than 80.

## 2015-10-17 ENCOUNTER — Other Ambulatory Visit: Payer: Self-pay | Admitting: Family Medicine

## 2015-10-21 ENCOUNTER — Other Ambulatory Visit: Payer: Self-pay | Admitting: Family Medicine

## 2015-10-25 ENCOUNTER — Other Ambulatory Visit: Payer: Self-pay

## 2015-10-25 MED ORDER — INSULIN SYRINGES (DISPOSABLE) U-100 0.5 ML MISC: Status: DC

## 2015-11-03 ENCOUNTER — Other Ambulatory Visit: Payer: Self-pay

## 2015-11-03 MED ORDER — LANCETS 28G MISC: Status: DC

## 2015-11-03 MED ORDER — GLUCOSE BLOOD VI STRP: ORAL_STRIP | Status: DC

## 2015-11-17 ENCOUNTER — Encounter: Payer: Self-pay | Admitting: Family Medicine

## 2015-11-22 ENCOUNTER — Other Ambulatory Visit: Payer: Self-pay | Admitting: Family Medicine

## 2015-11-22 DIAGNOSIS — IMO0001 Reserved for inherently not codable concepts without codable children: Secondary | ICD-10-CM

## 2015-11-22 DIAGNOSIS — R29898 Other symptoms and signs involving the musculoskeletal system: Secondary | ICD-10-CM

## 2015-11-30 ENCOUNTER — Telehealth: Payer: Self-pay | Admitting: *Deleted

## 2015-11-30 NOTE — Telephone Encounter (Signed)
Gave VO for continuation of speech therapy for 2x a week for the next 4 weeks.Loralee PacasBarkley, Sabrina Dougherty [place]

## 2015-12-19 ENCOUNTER — Other Ambulatory Visit: Payer: Self-pay | Admitting: *Deleted

## 2015-12-19 DIAGNOSIS — E119 Type 2 diabetes mellitus without complications: Secondary | ICD-10-CM

## 2015-12-19 MED ORDER — LANCETS 28G MISC: Status: DC

## 2015-12-19 MED ORDER — AMBULATORY NON FORMULARY MEDICATION
Status: DC
Start: 2015-12-19 — End: 2018-02-04

## 2015-12-19 MED ORDER — GLUCOSE BLOOD VI STRP: ORAL_STRIP | Status: DC

## 2015-12-22 ENCOUNTER — Other Ambulatory Visit: Payer: Self-pay | Admitting: *Deleted

## 2015-12-22 MED ORDER — ONETOUCH LANCETS MISC: Status: DC

## 2015-12-26 ENCOUNTER — Telehealth: Payer: Self-pay

## 2015-12-26 ENCOUNTER — Ambulatory Visit (INDEPENDENT_AMBULATORY_CARE_PROVIDER_SITE_OTHER): Payer: 59 | Admitting: Family Medicine

## 2015-12-26 ENCOUNTER — Encounter: Payer: Self-pay | Admitting: Family Medicine

## 2015-12-26 VITALS — BP 138/72 | HR 74 | Wt 163.0 lb

## 2015-12-26 DIAGNOSIS — I69920 Aphasia following unspecified cerebrovascular disease: Secondary | ICD-10-CM | POA: Diagnosis not present

## 2015-12-26 DIAGNOSIS — F329 Major depressive disorder, single episode, unspecified: Secondary | ICD-10-CM

## 2015-12-26 DIAGNOSIS — F32A Depression, unspecified: Secondary | ICD-10-CM

## 2015-12-26 DIAGNOSIS — R4701 Aphasia: Secondary | ICD-10-CM

## 2015-12-26 DIAGNOSIS — M7989 Other specified soft tissue disorders: Secondary | ICD-10-CM

## 2015-12-26 DIAGNOSIS — E119 Type 2 diabetes mellitus without complications: Secondary | ICD-10-CM | POA: Diagnosis not present

## 2015-12-26 DIAGNOSIS — I10 Essential (primary) hypertension: Secondary | ICD-10-CM

## 2015-12-26 DIAGNOSIS — I69320 Aphasia following cerebral infarction: Secondary | ICD-10-CM

## 2015-12-26 LAB — POCT GLYCOSYLATED HEMOGLOBIN (HGB A1C): HEMOGLOBIN A1C: 9.3

## 2015-12-26 MED ORDER — FLUOXETINE HCL 10 MG PO CAPS: ORAL_CAPSULE | ORAL | Status: DC

## 2015-12-26 NOTE — Telephone Encounter (Signed)
[facility]rina [facility] from speech therapy home health. She is no longer home bond. She recommends out patient speech therapy.

## 2015-12-26 NOTE — Progress Notes (Signed)
   Subjective:    Patient ID: Sabrina Dougherty, female    DOB: 09/22/1960, 55 y.o.   MRN: [id number]  HPI Diabetes - no hypoglycemic events. No wounds or sores that are not healing well. No increased thirst or urination. Checking glucose at home. Taking medications as prescribed without any side effects. Has gained 8 lbs since I last saw her.  She admits she's been tracking a lot of sugary beverages and says she knows that she needs to quit doing that she said she's just gotten into a habit of it.  Hypertension- Pt denies chest pain, SOB, dizziness, or heart palpitations.  Taking meds as directed w/o problems.  Denies medication side effects.    She is ready to move forward with colon cancer screening. Discussed Colocort as an option.  He also discussed that she has felt sad and more down and depressed since having her stroke. She would actually like to discuss treatment options.She also helps take care of her daughter who requires 24-hour care and has special needs. Her husband is with her here today and he seems supportive.  Also c/o of right thumb swelling.  She says it occ bothers her. No trauma or injury. She wants an xray to evaluate further.   Review of Systems     Objective:   Physical Exam  Constitutional: She is oriented to person, place, and time. She appears well-developed and well-nourished.  HENT:  Head: Normocephalic and atraumatic.  Cardiovascular: Normal rate, regular rhythm and normal heart sounds.   Pulmonary/Chest: Effort normal and breath sounds normal.  Neurological: She is alert and oriented to person, place, and time.  Skin: Skin is warm and dry.  Psychiatric: She has a normal mood and affect. Her behavior is normal.        Assessment & Plan:  DM- Uncontrolled.  A1C down a little from 10.1.  We discussed the importance of stopping the sugary beverages. We also discussed starting an additional agent such as [place]Farxiga or [place]ardiance. We also discussed potential  side effects. She really wants to work on her diet and exercise first before adding a medication. We agreed to 3 months but if she is not at goal with her A1c at that point in time we are going to add a medication. Lab Results  Component Value Date   HGBA1C 9.3 12/26/2015   Hypertension- Well controlled. Continue current regimen. Follow up in 6 mo  Was given a PHQ 9 questionnaire to complete but said she couldn't focus well enough to do it..  Says medication treatment options. Will start with fluoxetine. One discussed potential side effects.  Colon cancer screening-she's agreed to do Colocort. Form faxing completed today.  Right thumb pain-x-ray ordered today. Call for results once available.

## 2015-12-26 NOTE — Patient Instructions (Signed)
We can consider adding Florene RouteFarxiga, Jardiance, or Invokana to help lower your hemoglobin A1c at next office visit if you are not able to get your A1c down under 7.5.

## 2015-12-26 NOTE — Telephone Encounter (Signed)
Ok. Please call patient and see if she would like to do outpatient speech. She didn't mention it today at OV.

## 2015-12-27 NOTE — Telephone Encounter (Signed)
Left message advising or recommendations and a return call.

## 2015-12-29 NOTE — Telephone Encounter (Signed)
Patient agreed to do outpatient speech therapy. Her husband would like her to have a different depression medication due to all the side effects. Please advise.

## 2016-01-02 ENCOUNTER — Ambulatory Visit: Payer: 59 | Admitting: Family Medicine

## 2016-01-02 MED ORDER — ESCITALOPRAM OXALATE 10 MG PO TABS: 10.0000 mg | ORAL_TABLET | Freq: Every day | ORAL | Status: DC

## 2016-01-02 NOTE — Telephone Encounter (Signed)
Left detailed message advising of recommendations and for a return call.  °

## 2016-01-02 NOTE — Telephone Encounter (Signed)
Okay, [facility]. Okay to place referral for speech therapy.

## 2016-01-10 ENCOUNTER — Telehealth: Payer: Self-pay | Admitting: Family Medicine

## 2016-01-10 NOTE — Telephone Encounter (Signed)
Patient's husband called and request to get a referral to speech therapy closer to [place] and not in [place]. Their is a referral set up for Speech center but they said its too far and would like something closer. Thanks

## 2016-01-11 NOTE — Telephone Encounter (Signed)
Left message for patient to return call.

## 2016-01-11 NOTE — Telephone Encounter (Signed)
I am currently trying to find one that sees adults closer to their area and will resend once I do. - CF

## 2016-02-08 ENCOUNTER — Telehealth: Payer: Self-pay | Admitting: Family Medicine

## 2016-02-08 DIAGNOSIS — I639 Cerebral infarction, unspecified: Secondary | ICD-10-CM

## 2016-02-08 NOTE — Telephone Encounter (Signed)
I would be happy place referral if they would like. Especially with her strict history I think that this is absolutely reasonable.

## 2016-02-08 NOTE — Telephone Encounter (Signed)
Dr. Linford ArnoldMetheney. Sabrina Dougherty called, Sabrina Dougherty significant other. He wants to talk about the possibility of referring Sabrina. [name] to a Insurance account managereurologist.  His telephone number is [phone]

## 2016-02-09 NOTE — Telephone Encounter (Signed)
lvm informing Mr. Sabrina Dougherty that referral has been placed.Loralee PacasBarkley, Hulet Ehrmann [place]etta

## 2016-02-21 ENCOUNTER — Telehealth: Payer: Self-pay

## 2016-02-21 ENCOUNTER — Other Ambulatory Visit: Payer: Self-pay | Admitting: Family Medicine

## 2016-02-21 NOTE — Telephone Encounter (Signed)
If she's waking up with low blood sugars then try decreasing the Lantus to 40 units instead of 45 units. If she is giving a different dose than please let me know. Let's have her back down to half a tab of Lexapro and see if she is doing better after a week.

## 2016-02-22 NOTE — Telephone Encounter (Signed)
Pt stated stated that she is taking 35 units in the pm.  She will try the Lexapro 1/2 tab in the evening.  Please advise as far as the insulin dosage.

## 2016-02-22 NOTE — Telephone Encounter (Signed)
Left message for pt to call for instructions

## 2016-02-23 NOTE — Telephone Encounter (Signed)
Spoke with patient about decreasing Lantus to 32 units at bedtime and see if that helps her low morning BS.

## 2016-02-23 NOTE — Telephone Encounter (Signed)
Pt husband called to verify correct Lantus dosage. Went over change again in detail. verbalized understanding, no further questions.

## 2016-02-23 NOTE — Telephone Encounter (Signed)
If she is on 35 units of Lantus and have her decrease to 32 units and see if the morning blood sugars are running better.

## 2016-02-28 ENCOUNTER — Other Ambulatory Visit: Payer: Self-pay

## 2016-02-28 MED ORDER — ESCITALOPRAM OXALATE 5 MG PO TABS: 5.0000 mg | ORAL_TABLET | Freq: Every day | ORAL | Status: DC

## 2016-03-19 LAB — HM MAMMOGRAPHY

## 2016-03-21 ENCOUNTER — Encounter: Payer: Self-pay | Admitting: Family Medicine

## 2016-03-26 ENCOUNTER — Ambulatory Visit (INDEPENDENT_AMBULATORY_CARE_PROVIDER_SITE_OTHER): Payer: 59 | Admitting: Family Medicine

## 2016-03-26 ENCOUNTER — Encounter: Payer: Self-pay | Admitting: Family Medicine

## 2016-03-26 VITALS — BP 143/67 | HR 63 | Wt 162.0 lb

## 2016-03-26 DIAGNOSIS — R21 Rash and other nonspecific skin eruption: Secondary | ICD-10-CM | POA: Diagnosis not present

## 2016-03-26 DIAGNOSIS — I10 Essential (primary) hypertension: Secondary | ICD-10-CM

## 2016-03-26 DIAGNOSIS — E119 Type 2 diabetes mellitus without complications: Secondary | ICD-10-CM

## 2016-03-26 LAB — POCT GLYCOSYLATED HEMOGLOBIN (HGB A1C): HEMOGLOBIN A1C: 8

## 2016-03-26 MED ORDER — METOPROLOL SUCCINATE ER 25 MG PO TB24: 25.0000 mg | ORAL_TABLET | Freq: Every day | ORAL | Status: DC

## 2016-03-26 MED ORDER — TRIAMCINOLONE ACETONIDE 0.5 % EX OINT: 1.0000 "application " | TOPICAL_OINTMENT | Freq: Every day | CUTANEOUS | Status: DC | PRN

## 2016-03-26 NOTE — Progress Notes (Signed)
Subjective:    CC: DM  HPI:  Diabetes - no hypoglycemic events. No wounds or sores that are not healing well. No increased thirst or urination. Checking glucose at home. Taking medications as prescribed without any side effects.She reports that she is using 25 units of Lantus.  Rash red bumps on around both ankles. the one on the R ankle began last wk with itching she is not using anything to treat this. He says it has been fairly itchy. She doesn't know what may have caused it.        Hypertension- Pt denies chest pain, SOB, dizziness, or heart palpitations.  Taking meds as directed w/o problems.  Denies medication side effects.      Past medical history, Surgical history, Family history not pertinant except as noted below, Social history, Allergies, and medications have been entered into the medical record, reviewed, and corrections made.   Review of Systems: No fevers, chills, night sweats, weight loss, chest pain, or shortness of breath.   Objective:    General: Well Developed, well nourished, and in no acute distress.  Neuro: Alert and oriented x3, extra-ocular muscles intact, sensation grossly intact.  HEENT: Normocephalic, atraumatic  Skin: Warm and dry. She does have some erythematous fine scabs approximately 1-2 mm in size scattered around her ankles. No significant edema or induration. No open wounds or drainage. No actual papules. Cardiac: Regular rate and rhythm, no murmurs rubs or gallops, no lower extremity edema.  Respiratory: Clear to auscultation bilaterally. Not using accessory muscles, speaking in full sentences.   Impression and Recommendations:   DM- Improving.  Down to 8.0 today. Increase Lantus to 30 units.   Lab Results  Component Value Date   HGBA1C 8.0 03/26/2016   HTN - uncontrolled. But think she may not have taken her blood pressure pill today. We'll start low-dose metoprolol 25 mg. The last couple times she's been her pressures been mildly elevated.  Follow-up in 3 months.  Rash - unclear etiology. Certainly could've been insect bites though they're not papular. She has been scratching at the area so some of the small scab that I'm seeing may just be irritation from scratching. Will treat with triamcinolone. Call if not better in 2 weeks.

## 2016-03-26 NOTE — Patient Instructions (Addendum)
Increase Lantus to 30 units at bedtime. Try to go for your blood work when you are able to. You will need to fast for about 8 hours but you can drink a lot of water before you go for the test. Will add metoprolol to better control your blood pressure

## 2016-04-09 ENCOUNTER — Telehealth: Payer: Self-pay | Admitting: Family Medicine

## 2016-04-09 DIAGNOSIS — R4701 Aphasia: Secondary | ICD-10-CM

## 2016-04-09 NOTE — Telephone Encounter (Signed)
Significant other called. He was following up on a letter in regard to getting help with their disabled daughter due to Ms Carlston current condition. In addition he wants referral sent to [facility] so that she can continue getting speech therapy.  Thank you.

## 2016-04-17 ENCOUNTER — Encounter: Payer: Self-pay | Admitting: Family Medicine

## 2016-04-17 NOTE — Telephone Encounter (Signed)
Letter printed and ready. Ok to place referral per note.

## 2016-04-26 NOTE — Addendum Note (Signed)
Addended by: Deno EtienneBARKLEY, Maximum Reiland L on: 04/26/2016 11:27 AM   Modules accepted: Orders

## 2016-05-02 ENCOUNTER — Encounter: Payer: Self-pay | Admitting: Family Medicine

## 2016-05-02 ENCOUNTER — Other Ambulatory Visit: Payer: Self-pay | Admitting: Family Medicine

## 2016-05-02 DIAGNOSIS — E119 Type 2 diabetes mellitus without complications: Secondary | ICD-10-CM

## 2016-05-02 DIAGNOSIS — I10 Essential (primary) hypertension: Secondary | ICD-10-CM

## 2016-05-02 LAB — HEPATIC FUNCTION PANEL
ALK PHOS: 101 U/L (ref 25–125)
ALT: 13 U/L (ref 7–35)
AST: 16 U/L (ref 13–35)
Bilirubin, Total: 0.4 mg/dL

## 2016-05-02 LAB — BASIC METABOLIC PANEL
BUN: 14 mg/dL (ref 4–21)
CREATININE: 0.8 mg/dL (ref 0.5–1.1)
Glucose: 275 mg/dL
Potassium: 4.6 mmol/L (ref 3.4–5.3)
Sodium: 139 mmol/L (ref 137–147)

## 2016-05-02 LAB — LIPID PANEL
CHOLESTEROL: 113 mg/dL (ref 0–200)
HDL: 73 mg/dL — AB (ref 35–70)
LDL CALC: 30 mg/dL
TRIGLYCERIDES: 48 mg/dL (ref 40–160)

## 2016-05-02 NOTE — Progress Notes (Signed)
Orders faxed to [facility] (F: [phone]) per Pt request.

## 2016-05-03 ENCOUNTER — Other Ambulatory Visit: Payer: Self-pay

## 2016-05-03 MED ORDER — ESCITALOPRAM OXALATE 5 MG PO TABS: 5.0000 mg | ORAL_TABLET | Freq: Every day | ORAL | 0 refills | Status: DC

## 2016-05-04 ENCOUNTER — Encounter: Payer: Self-pay | Admitting: Family Medicine

## 2016-06-02 ENCOUNTER — Other Ambulatory Visit: Payer: Self-pay | Admitting: Family Medicine

## 2016-07-02 ENCOUNTER — Ambulatory Visit (INDEPENDENT_AMBULATORY_CARE_PROVIDER_SITE_OTHER): Payer: 59 | Admitting: Family Medicine

## 2016-07-02 ENCOUNTER — Encounter: Payer: Self-pay | Admitting: Family Medicine

## 2016-07-02 VITALS — BP 138/52 | HR 72 | Wt 166.0 lb

## 2016-07-02 DIAGNOSIS — I10 Essential (primary) hypertension: Secondary | ICD-10-CM | POA: Diagnosis not present

## 2016-07-02 DIAGNOSIS — F329 Major depressive disorder, single episode, unspecified: Secondary | ICD-10-CM | POA: Diagnosis not present

## 2016-07-02 DIAGNOSIS — E118 Type 2 diabetes mellitus with unspecified complications: Secondary | ICD-10-CM | POA: Diagnosis not present

## 2016-07-02 DIAGNOSIS — F32A Depression, unspecified: Secondary | ICD-10-CM

## 2016-07-02 DIAGNOSIS — Z794 Long term (current) use of insulin: Secondary | ICD-10-CM | POA: Diagnosis not present

## 2016-07-02 LAB — POCT GLYCOSYLATED HEMOGLOBIN (HGB A1C): Hemoglobin A1C: 9.1

## 2016-07-02 MED ORDER — METOPROLOL SUCCINATE ER 25 MG PO TB24: 25.0000 mg | ORAL_TABLET | Freq: Every day | ORAL | 3 refills | Status: DC

## 2016-07-02 MED ORDER — ESCITALOPRAM OXALATE 10 MG PO TABS: 10.0000 mg | ORAL_TABLET | Freq: Every day | ORAL | 1 refills | Status: DC

## 2016-07-02 NOTE — Progress Notes (Signed)
Subjective:    CC: DM, HTN  HPI: Diabetes - no hypoglycemic events. No wounds or sores that are not healing well. No increased thirst or urination. Checking glucose at home. Taking medications as prescribed without any side effects.  She is taking 35 units of Lantus now.  She was supposed to be using 45 but says she didn't realize that and so had Taking 35 units. She's been exercising some but not regularly. Last A1C was 8 in July.    Hypertension- Pt denies chest pain, SOB, dizziness, or heart palpitations.  Taking meds as directed w/o problems.  Denies medication side effects.  She realize that she's not been taking her metoprolol and needs a new prescription sent to the pharmacy. She did get a lab core for her blood work and I printed a copy of that and gave it to her today. We reviewed a here in the office.  Follow-up depression-we recently started on Lexapro 5 mg. She doesn't feel as sedated as she did on the fluoxetine but is not sure it's working as well.   Past medical history, Surgical history, Family history not pertinant except as noted below, Social history, Allergies, and medications have been entered into the medical record, reviewed, and corrections made.   Review of Systems: No fevers, chills, night sweats, weight loss, chest pain, or shortness of breath.   Objective:    General: Well Developed, well nourished, and in no acute distress.  Neuro: Alert and oriented x3, extra-ocular muscles intact, sensation grossly intact.  HEENT: Normocephalic, atraumatic  Skin: Warm and dry, no rashes. Cardiac: Regular rate and rhythm, no murmurs rubs or gallops, no lower extremity edema.  Respiratory: Clear to auscultation bilaterally. Not using accessory muscles, speaking in full sentences.   Impression and Recommendations:   DM -  Uncontrolled. Increase Lantus to 45 units and f/U in 3 months. Continue work on [employer] diet and regular exercise. Normally when she gets back on track with  this her A1c usually comes down.  HTN  - Well controlled. Continue current regimen. Follow up in  3-4 months.   Acute depression-we will increase Lexapro to 10 mg daily. Fluoxetine added to intolerance list. PHQ- 9 score of 15, not well controlled. Hopefully the bump on the Lexapro will help. In 3 months.

## 2016-07-02 NOTE — Patient Instructions (Signed)
Increase Lantus to 40 units daily. Then after one week increase to 45 units if needed.   I increased Lexapro to 10mg  and sent the script to mail order.

## 2016-07-03 ENCOUNTER — Other Ambulatory Visit: Payer: Self-pay

## 2016-07-03 MED ORDER — METOPROLOL SUCCINATE ER 25 MG PO TB24: 25.0000 mg | ORAL_TABLET | Freq: Every day | ORAL | 0 refills | Status: DC

## 2016-07-03 MED ORDER — ESCITALOPRAM OXALATE 10 MG PO TABS: 10.0000 mg | ORAL_TABLET | Freq: Every day | ORAL | 0 refills | Status: DC

## 2016-07-03 NOTE — Progress Notes (Signed)
Sent over short supply of Rx until mail order can get Rx shipped.

## 2016-07-09 ENCOUNTER — Telehealth: Payer: Self-pay

## 2016-07-09 DIAGNOSIS — I63512 Cerebral infarction due to unspecified occlusion or stenosis of left middle cerebral artery: Secondary | ICD-10-CM

## 2016-07-09 DIAGNOSIS — I69320 Aphasia following cerebral infarction: Secondary | ICD-10-CM

## 2016-07-09 NOTE — Telephone Encounter (Signed)
Ronnie called to see if the referral for speech therapy has been placed. I do not see a recent referral for speech therapy. Is it ok to order and what would be the diagnosis. Please advise.

## 2016-07-09 NOTE — Telephone Encounter (Signed)
Yes please order. Thank you. Can use stroke dx.

## 2016-07-11 ENCOUNTER — Other Ambulatory Visit: Payer: Self-pay | Admitting: Family Medicine

## 2016-07-12 NOTE — Telephone Encounter (Signed)
Left a message for her husband to call me back. [facility]

## 2016-08-12 ENCOUNTER — Other Ambulatory Visit: Payer: Self-pay | Admitting: Family Medicine

## 2016-08-13 ENCOUNTER — Other Ambulatory Visit: Payer: Self-pay | Admitting: Family Medicine

## 2016-10-01 ENCOUNTER — Ambulatory Visit: Payer: 59 | Admitting: Family Medicine

## 2016-10-02 ENCOUNTER — Other Ambulatory Visit: Payer: Self-pay | Admitting: Family Medicine

## 2016-10-09 ENCOUNTER — Ambulatory Visit (INDEPENDENT_AMBULATORY_CARE_PROVIDER_SITE_OTHER): Payer: 59 | Admitting: Family Medicine

## 2016-10-09 VITALS — BP 139/60

## 2016-10-09 DIAGNOSIS — I10 Essential (primary) hypertension: Secondary | ICD-10-CM

## 2016-10-09 DIAGNOSIS — Z794 Long term (current) use of insulin: Secondary | ICD-10-CM

## 2016-10-09 DIAGNOSIS — E118 Type 2 diabetes mellitus with unspecified complications: Secondary | ICD-10-CM

## 2016-10-09 DIAGNOSIS — Z1211 Encounter for screening for malignant neoplasm of colon: Secondary | ICD-10-CM

## 2016-10-09 LAB — HM DIABETES EYE EXAM

## 2016-10-09 LAB — POCT UA - MICROALBUMIN
CREATININE, POC: 200 mg/dL
MICROALBUMIN (UR) POC: 150 mg/L

## 2016-10-09 LAB — POCT GLYCOSYLATED HEMOGLOBIN (HGB A1C): HEMOGLOBIN A1C: 9.8

## 2016-10-09 MED ORDER — INSULIN GLARGINE 100 UNIT/ML ~~LOC~~ SOLN: SUBCUTANEOUS | 0 refills | Status: DC

## 2016-10-09 MED ORDER — METOPROLOL SUCCINATE ER 100 MG PO TB24: 100.0000 mg | ORAL_TABLET | Freq: Every day | ORAL | 0 refills | Status: DC

## 2016-10-09 NOTE — Patient Instructions (Addendum)
Increase Lantus to 50 units.   I am increasing your metoprolol to 100mg  and this should help lower your blood pressure

## 2016-10-09 NOTE — Progress Notes (Signed)
   Subjective:    Patient ID: Sabrina SpannerMichelle Yvette Limbach, female    DOB: 04/26/1961, 56 y.o.   MRN: [id number]  HPI Diabetes - no hypoglycemic events. No wounds or sores that are not healing well. No increased thirst or urination. Checking glucose at home. Taking medications as prescribed without any side effects.  Hypertension- Pt denies chest pain, SOB, dizziness, or heart palpitations.  Taking meds as directed w/o problems.  Denies medication side effects.    She was just diagnosed with the flu about a week ago. She finished her Tamiflu yesterday. She says she's actually feeling a lot better. No longer running a fever evidently her daughter and her husband were affected as well.  History of stroke-she is taking her Plavix and aspirin daily.  Review of Systems     Objective:   Physical Exam  Constitutional: She is oriented to person, place, and time. She appears well-developed and well-nourished.  HENT:  Head: Normocephalic and atraumatic.  Cardiovascular: Normal rate, regular rhythm and normal heart sounds.   Pulmonary/Chest: Effort normal and breath sounds normal.  Neurological: She is alert and oriented to person, place, and time.  Skin: Skin is warm and dry.  Psychiatric: She has a normal mood and affect. Her behavior is normal.          Assessment & Plan:  DM- Uncontrolled. A1c of 9.8.  Crease Lantus to 55 units. Did encourage her to get back into the gym and start exercising regularly and work on weight loss.  Follow-up in 3 months. Urine microalbumin performed today.   HTN - Uncontrolled. We'll increase metoprolol from 25 mg 200 mg. Monitor for bradycardia lightheadedness and dizziness. Did encourage her to get back into the gym and start exercising regularly and work on weight loss.  Influenza-symptoms resolved.  History of stroke-continue aspirin and Plavix. We'll work on getting better blood pressure control as well as diabetes control.  She is okay with getting a  colonoscopy but just would prefer [place] location since that closer to where she lives. Referral placed today.

## 2016-10-25 ENCOUNTER — Encounter: Payer: Self-pay | Admitting: Family Medicine

## 2016-12-01 ENCOUNTER — Other Ambulatory Visit: Payer: Self-pay | Admitting: Family Medicine

## 2016-12-27 ENCOUNTER — Encounter: Payer: Self-pay | Admitting: Family Medicine

## 2016-12-27 LAB — COLOGUARD

## 2017-01-01 ENCOUNTER — Other Ambulatory Visit: Payer: Self-pay | Admitting: Family Medicine

## 2017-01-04 ENCOUNTER — Ambulatory Visit: Payer: 59 | Admitting: Family Medicine

## 2017-01-07 ENCOUNTER — Ambulatory Visit (INDEPENDENT_AMBULATORY_CARE_PROVIDER_SITE_OTHER): Payer: 59 | Admitting: Family Medicine

## 2017-01-07 ENCOUNTER — Encounter: Payer: Self-pay | Admitting: Family Medicine

## 2017-01-07 VITALS — BP 140/80 | HR 73 | Ht 62.0 in | Wt 177.0 lb

## 2017-01-07 DIAGNOSIS — F329 Major depressive disorder, single episode, unspecified: Secondary | ICD-10-CM | POA: Diagnosis not present

## 2017-01-07 DIAGNOSIS — Z794 Long term (current) use of insulin: Secondary | ICD-10-CM | POA: Diagnosis not present

## 2017-01-07 DIAGNOSIS — I6930 Unspecified sequelae of cerebral infarction: Secondary | ICD-10-CM

## 2017-01-07 DIAGNOSIS — I10 Essential (primary) hypertension: Secondary | ICD-10-CM

## 2017-01-07 DIAGNOSIS — E118 Type 2 diabetes mellitus with unspecified complications: Secondary | ICD-10-CM

## 2017-01-07 DIAGNOSIS — F32A Depression, unspecified: Secondary | ICD-10-CM

## 2017-01-07 LAB — POCT GLYCOSYLATED HEMOGLOBIN (HGB A1C): Hemoglobin A1C: >14

## 2017-01-07 MED ORDER — ESCITALOPRAM OXALATE 10 MG PO TABS: 10.0000 mg | ORAL_TABLET | Freq: Every day | ORAL | 1 refills | Status: DC

## 2017-01-07 NOTE — Patient Instructions (Signed)
Increase her Lantus by 2 units each night. For example tonight give 52 units, tomorrow night gets 54 units, the following night gets 56 units. Check blood sugar first thing in the morning before breakfast. When sure blood sugar is under 130 then stay at that dose on your insulin. Continue to work on [employer]. Encourage her to cut out the sugary foods and diet soda and start exercising again. Make sure hydrating well especially started to exercise.

## 2017-01-07 NOTE — Progress Notes (Signed)
Subjective:    CC: DM, HTN,  HPI:  Diabetes - no hypoglycemic events. No wounds or sores that are not healing well. + increased thirst and urination.  Rarely  checking glucose at home. Taking medications as prescribed without any side effects. She admits she's been eating a lot. She's gained a lot of weight and says she's been drinking a lot of diet soda. She has not been exercising. No altered mentation on exam today.  Hypertension- Pt denies chest pain, SOB, dizziness, or heart palpitations.  Taking meds as directed w/o problems.  Denies medication side effects.    Depression - she is no longer taking her lexapro.  She says she stopped it because she wanted to see if she still really needed it but she does feel like it was helping and would like to restart it today.   Past medical history, Surgical history, Family history not pertinant except as noted below, Social history, Allergies, and medications have been entered into the medical record, reviewed, and corrections made.   Review of Systems: No fevers, chills, night sweats, weight loss, chest pain, or shortness of breath.   Objective:    General: Well Developed, well nourished, and in no acute distress.  Neuro: Alert and oriented x3, extra-ocular muscles intact, sensation grossly intact.  HEENT: Normocephalic, atraumatic  Skin: Warm and dry, no rashes. Cardiac: Regular rate and rhythm, no murmurs rubs or gallops, no lower extremity edema.  Respiratory: Clear to auscultation bilaterally. Not using accessory muscles, speaking in full sentences.   Impression and Recommendations:    DM- Uncontrolled. Hemoglobin A1c greater than 14. She's had some retrospect she has noticed some increased thirst and urination. Will have her increase her Lantus by 2 units each night until her fasting blood sugar in the morning is less than 130. I'll see her back in 6 weeks to make sure that we are getting back on track.  HTN - BP borderline. Work on  weight loss and getting glucose under better control.   Depression/anxiety-she like to restart her Lexapro. New perception sent to the pharmacy. I'll see her back in 6 weeks.  Overdue for screening colonoscopy.  We placed an order in January but she says she has not heard from their office. I'll get our referral coordinator to call them and see if they can try to get in touch with her to get scheduled.

## 2017-01-10 ENCOUNTER — Other Ambulatory Visit: Payer: Self-pay | Admitting: Family Medicine

## 2017-01-14 ENCOUNTER — Other Ambulatory Visit: Payer: Self-pay

## 2017-01-14 DIAGNOSIS — Z13220 Encounter for screening for lipoid disorders: Secondary | ICD-10-CM

## 2017-01-14 MED ORDER — ATORVASTATIN CALCIUM 80 MG PO TABS: ORAL_TABLET | ORAL | 0 refills | Status: DC

## 2017-02-22 ENCOUNTER — Ambulatory Visit: Payer: 59 | Admitting: Family Medicine

## 2017-03-08 ENCOUNTER — Other Ambulatory Visit: Payer: Self-pay | Admitting: Family Medicine

## 2017-03-09 LAB — LIPID PANEL W/O CHOL/HDL RATIO
CHOLESTEROL TOTAL: 140 mg/dL (ref 100–199)
HDL: 73 mg/dL (ref >39)
LDL Calculated: 51 mg/dL (ref 0–99)
Triglycerides: 79 mg/dL (ref 0–149)
VLDL CHOLESTEROL CAL: 16 mg/dL (ref 5–40)

## 2017-03-13 ENCOUNTER — Other Ambulatory Visit: Payer: Self-pay | Admitting: Family Medicine

## 2017-03-15 ENCOUNTER — Encounter: Payer: Self-pay | Admitting: Family Medicine

## 2017-03-15 ENCOUNTER — Ambulatory Visit (INDEPENDENT_AMBULATORY_CARE_PROVIDER_SITE_OTHER): Payer: 59 | Admitting: Family Medicine

## 2017-03-15 VITALS — BP 122/63 | HR 79 | Resp 16 | Wt 177.5 lb

## 2017-03-15 DIAGNOSIS — I10 Essential (primary) hypertension: Secondary | ICD-10-CM | POA: Diagnosis not present

## 2017-03-15 DIAGNOSIS — E118 Type 2 diabetes mellitus with unspecified complications: Secondary | ICD-10-CM

## 2017-03-15 DIAGNOSIS — Z8673 Personal history of transient ischemic attack (TIA), and cerebral infarction without residual deficits: Secondary | ICD-10-CM

## 2017-03-15 DIAGNOSIS — R4701 Aphasia: Secondary | ICD-10-CM | POA: Diagnosis not present

## 2017-03-15 DIAGNOSIS — Z794 Long term (current) use of insulin: Secondary | ICD-10-CM

## 2017-03-15 LAB — POCT GLYCOSYLATED HEMOGLOBIN (HGB A1C): HEMOGLOBIN A1C: 10.3

## 2017-03-15 NOTE — Progress Notes (Signed)
Subjective:    CC: DM, restart of Lexapro.   HPI:  Diabetes - no hypoglycemic events. No wounds or sores that are not healing well. No increased thirst or urination. Checking glucose at home. Taking medications as prescribed without any side effects.  Hypertension- Pt denies chest pain, SOB, dizziness, or heart palpitations.  Taking meds as directed w/o problems.  Denies medication side effects.    Depression - she is doing ok on the Lexapro. We decided to restart it about 2 months ago. Feels it is helping some. She's not expressing any side effects or concerns with the medication would like to continue it for now.  Aphasia-she said she really like to get back to [facility]. She did several sessions back in the fall and still had some left that she could use with her insurance. She like to get back in.  Past medical history, Surgical history, Family history not pertinant except as noted below, Social history, Allergies, and medications have been entered into the medical record, reviewed, and corrections made.   Review of Systems: No fevers, chills, night sweats, weight loss, chest pain, or shortness of breath.   Objective:    General: Well Developed, well nourished, and in no acute distress.  Neuro: Alert and oriented x3, extra-ocular muscles intact, sensation grossly intact.  HEENT: Normocephalic, atraumatic  Skin: Warm and dry, no rashes. Cardiac: Regular rate and rhythm, no murmurs rubs or gallops, no lower extremity edema.  Respiratory: Clear to auscultation bilaterally. Not using accessory muscles, speaking in full sentences.   Impression and Recommendations:    DM-Uncontrolled but hemoglobin A1c down to 10.3 which is better than greater than 14. Cytophil like she is getting back on track. She has had a couple lows since I last saw her so we'll stay at 54 units of Lantus until I see her back in 3 months. Strongly encouraged her to get back into the  gym. When she's working out her blood sugars are significantly better.  HTN- well-controlled. Continue current regimen. She is due for CMP. Unfortunately we did not order it when we ordered her lipid panels will plan to do it at next office visit in 3 months.  Depression- continue with Lexapro for now. No recent changes.  Aphasia - refer back to [facility]. She specifically that she worked with they're so she can certainly request if she is still there.

## 2017-03-15 NOTE — Patient Instructions (Signed)
Make sure to call [facility] to schedule your colonoscopy. They have been trying to get in touch with you. Continue with Lantus 54 units daily.

## 2017-03-15 NOTE — Addendum Note (Signed)
Addended by: Baird KayUGLAS, Keaghan Bowens M on: 03/15/2017 02:29 PM   Modules accepted: Orders

## 2017-04-26 LAB — HM MAMMOGRAPHY

## 2017-05-01 ENCOUNTER — Telehealth: Payer: Self-pay | Admitting: Family Medicine

## 2017-05-01 NOTE — Telephone Encounter (Signed)
Pt's husband called.  He's requesting lab orders for liver and kidney tests(he didn't sound sure  He wants to come by tomorrow.Thank you.

## 2017-05-01 NOTE — Telephone Encounter (Signed)
Labs are in. Left VM for Pt advising.

## 2017-05-02 ENCOUNTER — Other Ambulatory Visit: Payer: Self-pay | Admitting: Family Medicine

## 2017-05-02 DIAGNOSIS — Z794 Long term (current) use of insulin: Secondary | ICD-10-CM

## 2017-05-02 DIAGNOSIS — E118 Type 2 diabetes mellitus with unspecified complications: Secondary | ICD-10-CM

## 2017-05-03 ENCOUNTER — Other Ambulatory Visit: Payer: Self-pay | Admitting: Family Medicine

## 2017-05-04 LAB — CMP14+EGFR
ALT: 19 IU/L (ref 0–32)
AST: 16 IU/L (ref 0–40)
Albumin/Globulin Ratio: 1.8 (ref 1.2–2.2)
Albumin: 4.5 g/dL (ref 3.5–5.5)
Alkaline Phosphatase: 84 IU/L (ref 39–117)
BUN/Creatinine Ratio: 21 (ref 9–23)
BUN: 16 mg/dL (ref 6–24)
Bilirubin Total: 0.3 mg/dL (ref 0.0–1.2)
CO2: 27 mmol/L (ref 20–29)
Calcium: 9.3 mg/dL (ref 8.7–10.2)
Chloride: 99 mmol/L (ref 96–106)
Creatinine, Ser: 0.77 mg/dL (ref 0.57–1.00)
GFR calc Af Amer: 99 mL/min/{1.73_m2} (ref >59)
GFR calc non Af Amer: 86 mL/min/{1.73_m2} (ref >59)
Globulin, Total: 2.5 g/dL (ref 1.5–4.5)
Glucose: 242 mg/dL — ABNORMAL HIGH (ref 65–99)
Potassium: 3.8 mmol/L (ref 3.5–5.2)
Sodium: 140 mmol/L (ref 134–144)
Total Protein: 7 g/dL (ref 6.0–8.5)

## 2017-05-06 NOTE — Progress Notes (Signed)
All labs are normal. 

## 2017-05-15 ENCOUNTER — Encounter: Payer: Self-pay | Admitting: Family Medicine

## 2017-06-06 ENCOUNTER — Other Ambulatory Visit: Payer: Self-pay | Admitting: Family Medicine

## 2017-06-06 DIAGNOSIS — F329 Major depressive disorder, single episode, unspecified: Secondary | ICD-10-CM

## 2017-06-06 DIAGNOSIS — F32A Depression, unspecified: Secondary | ICD-10-CM

## 2017-06-17 ENCOUNTER — Ambulatory Visit (INDEPENDENT_AMBULATORY_CARE_PROVIDER_SITE_OTHER): Payer: 59 | Admitting: Family Medicine

## 2017-06-17 ENCOUNTER — Encounter: Payer: Self-pay | Admitting: Family Medicine

## 2017-06-17 VITALS — BP 131/54 | HR 80 | Ht 62.0 in | Wt 182.0 lb

## 2017-06-17 DIAGNOSIS — Z794 Long term (current) use of insulin: Secondary | ICD-10-CM

## 2017-06-17 DIAGNOSIS — E118 Type 2 diabetes mellitus with unspecified complications: Secondary | ICD-10-CM

## 2017-06-17 DIAGNOSIS — I10 Essential (primary) hypertension: Secondary | ICD-10-CM | POA: Diagnosis not present

## 2017-06-17 DIAGNOSIS — I69320 Aphasia following cerebral infarction: Secondary | ICD-10-CM | POA: Diagnosis not present

## 2017-06-17 DIAGNOSIS — Z23 Encounter for immunization: Secondary | ICD-10-CM | POA: Diagnosis not present

## 2017-06-17 LAB — POCT GLYCOSYLATED HEMOGLOBIN (HGB A1C): HEMOGLOBIN A1C: 11.4

## 2017-06-17 MED ORDER — METOPROLOL SUCCINATE ER 100 MG PO TB24: 100.0000 mg | ORAL_TABLET | Freq: Every day | ORAL | 3 refills | Status: DC

## 2017-06-17 MED ORDER — AMLODIPINE BESYLATE 10 MG PO TABS: 10.0000 mg | ORAL_TABLET | Freq: Every day | ORAL | 1 refills | Status: DC

## 2017-06-17 MED ORDER — INSULIN GLARGINE 100 UNIT/ML ~~LOC~~ SOLN: 60.0000 [IU] | Freq: Every day | SUBCUTANEOUS | 2 refills | Status: DC

## 2017-06-17 MED ORDER — CLOPIDOGREL BISULFATE 75 MG PO TABS: 75.0000 mg | ORAL_TABLET | Freq: Every day | ORAL | 1 refills | Status: DC

## 2017-06-17 MED ORDER — METFORMIN HCL ER 500 MG PO TB24: 500.0000 mg | ORAL_TABLET | Freq: Every day | ORAL | 3 refills | Status: DC

## 2017-06-17 NOTE — Patient Instructions (Signed)
Increase Lantus to 60 units at bedtime.  Work on diet and exercise Bring in glucose log from home at next visit.

## 2017-06-17 NOTE — Progress Notes (Signed)
Subjective:    CC: DM  HPI:  Diabetes - Had one hypoglycemic event since last here. No wounds or sores that are not healing well. No increased thirst or urination. Not checking glucose at home. Taking medications as prescribed without any side effects. She is up to 52 units on her Lantus.  Hypertension- Pt denies chest pain, SOB, dizziness, or heart palpitations.  Taking meds as directed w/o problems.  Denies medication side effects.    S/P post stroke-she reports she is taking her aspirin and Plavix daily.  Past medical history, Surgical history, Family history not pertinant except as noted below, Social history, Allergies, and medications have been entered into the medical record, reviewed, and corrections made.   Review of Systems: No fevers, chills, night sweats, weight loss, chest pain, or shortness of breath.   Objective:    General: Well Developed, well nourished, and in no acute distress.  Neuro: Alert and oriented x3, extra-ocular muscles intact, sensation grossly intact.  HEENT: Normocephalic, atraumatic  Skin: Warm and dry, no rashes. Cardiac: Regular rate and rhythm, no murmurs rubs or gallops, no lower extremity edema.  Respiratory: Clear to auscultation bilaterally. Not using accessory muscles, speaking in full sentences.   Impression and Recommendations:    DM- Uncontrolled. Hemoglobin A1c elevated at 11.4. This is up from previous. She admits it's mostly related to her diet and not exercising but she really wants the opportunity to get back on track. We discussed following up in 6 weeks and bring in her glucometer. She will have to check her blood sugars so that we can make adjustments. For the short-term also can have her increase her Lantus to 60 units. We discussed if she can got not get her A1c under better control and I'm going to refer to endocrinology.  HTN - Well controlled. Continue current regimen. Follow up in  3 months. Refills sent to pharmacy.  Aphasia  status post CVA-will refill Plavix.  Colon Ca screening - given stool cards to take home.

## 2017-10-13 ENCOUNTER — Other Ambulatory Visit: Payer: Self-pay | Admitting: Family Medicine

## 2017-10-15 DIAGNOSIS — Z8673 Personal history of transient ischemic attack (TIA), and cerebral infarction without residual deficits: Secondary | ICD-10-CM | POA: Diagnosis not present

## 2017-10-15 DIAGNOSIS — I69320 Aphasia following cerebral infarction: Secondary | ICD-10-CM | POA: Diagnosis not present

## 2017-10-15 DIAGNOSIS — R4701 Aphasia: Secondary | ICD-10-CM | POA: Diagnosis not present

## 2017-10-22 DIAGNOSIS — I69320 Aphasia following cerebral infarction: Secondary | ICD-10-CM | POA: Diagnosis not present

## 2017-10-22 DIAGNOSIS — R4701 Aphasia: Secondary | ICD-10-CM | POA: Diagnosis not present

## 2017-10-22 DIAGNOSIS — Z8673 Personal history of transient ischemic attack (TIA), and cerebral infarction without residual deficits: Secondary | ICD-10-CM | POA: Diagnosis not present

## 2017-10-29 DIAGNOSIS — Z8673 Personal history of transient ischemic attack (TIA), and cerebral infarction without residual deficits: Secondary | ICD-10-CM | POA: Diagnosis not present

## 2017-10-29 DIAGNOSIS — R4701 Aphasia: Secondary | ICD-10-CM | POA: Diagnosis not present

## 2017-10-29 DIAGNOSIS — I69320 Aphasia following cerebral infarction: Secondary | ICD-10-CM | POA: Diagnosis not present

## 2017-11-07 DIAGNOSIS — I69320 Aphasia following cerebral infarction: Secondary | ICD-10-CM | POA: Diagnosis not present

## 2017-11-07 DIAGNOSIS — R4701 Aphasia: Secondary | ICD-10-CM | POA: Diagnosis not present

## 2017-11-07 DIAGNOSIS — Z8673 Personal history of transient ischemic attack (TIA), and cerebral infarction without residual deficits: Secondary | ICD-10-CM | POA: Diagnosis not present

## 2017-11-09 ENCOUNTER — Other Ambulatory Visit: Payer: Self-pay | Admitting: Family Medicine

## 2017-11-09 DIAGNOSIS — F32A Depression, unspecified: Secondary | ICD-10-CM

## 2017-11-09 DIAGNOSIS — F329 Major depressive disorder, single episode, unspecified: Secondary | ICD-10-CM

## 2017-11-15 DIAGNOSIS — I69320 Aphasia following cerebral infarction: Secondary | ICD-10-CM | POA: Diagnosis not present

## 2017-11-15 DIAGNOSIS — Z8673 Personal history of transient ischemic attack (TIA), and cerebral infarction without residual deficits: Secondary | ICD-10-CM | POA: Diagnosis not present

## 2017-11-15 DIAGNOSIS — R4701 Aphasia: Secondary | ICD-10-CM | POA: Diagnosis not present

## 2017-11-21 ENCOUNTER — Telehealth: Payer: Self-pay | Admitting: Family Medicine

## 2017-11-21 DIAGNOSIS — Z8673 Personal history of transient ischemic attack (TIA), and cerebral infarction without residual deficits: Secondary | ICD-10-CM | POA: Diagnosis not present

## 2017-11-21 DIAGNOSIS — I69320 Aphasia following cerebral infarction: Secondary | ICD-10-CM | POA: Diagnosis not present

## 2017-11-21 DIAGNOSIS — R4701 Aphasia: Secondary | ICD-10-CM | POA: Diagnosis not present

## 2017-11-21 NOTE — Telephone Encounter (Signed)
I called pts home number and left a message for her to call and scheduled a [facility]

## 2017-11-22 ENCOUNTER — Other Ambulatory Visit: Payer: Self-pay | Admitting: Family Medicine

## 2017-11-25 DIAGNOSIS — R4701 Aphasia: Secondary | ICD-10-CM | POA: Diagnosis not present

## 2017-11-25 DIAGNOSIS — Z8673 Personal history of transient ischemic attack (TIA), and cerebral infarction without residual deficits: Secondary | ICD-10-CM | POA: Diagnosis not present

## 2017-11-25 DIAGNOSIS — I69320 Aphasia following cerebral infarction: Secondary | ICD-10-CM | POA: Diagnosis not present

## 2017-11-25 NOTE — Telephone Encounter (Signed)
Must make appointment before any further refills given. KG LPN

## 2017-12-11 DIAGNOSIS — I69320 Aphasia following cerebral infarction: Secondary | ICD-10-CM | POA: Diagnosis not present

## 2017-12-11 DIAGNOSIS — R4701 Aphasia: Secondary | ICD-10-CM | POA: Diagnosis not present

## 2017-12-11 DIAGNOSIS — Z8673 Personal history of transient ischemic attack (TIA), and cerebral infarction without residual deficits: Secondary | ICD-10-CM | POA: Diagnosis not present

## 2018-01-02 ENCOUNTER — Telehealth: Payer: Self-pay | Admitting: Family Medicine

## 2018-01-02 DIAGNOSIS — Z13220 Encounter for screening for lipoid disorders: Secondary | ICD-10-CM

## 2018-01-02 DIAGNOSIS — I10 Essential (primary) hypertension: Secondary | ICD-10-CM

## 2018-01-02 DIAGNOSIS — Z794 Long term (current) use of insulin: Secondary | ICD-10-CM

## 2018-01-02 DIAGNOSIS — Z1329 Encounter for screening for other suspected endocrine disorder: Secondary | ICD-10-CM

## 2018-01-02 DIAGNOSIS — E118 Type 2 diabetes mellitus with unspecified complications: Secondary | ICD-10-CM

## 2018-01-02 NOTE — Telephone Encounter (Signed)
Pt made apt with Dr. Linford [facility]Metheney on 01/17/18 and wanted to know if there is blood work due. If so she is wanting to go ahead and get it done so she can go over the results with Dr. Linford ArnoldMetheney at her appt. The lab work needs to be under [facility] and not [facility]. Thanks

## 2018-01-03 NOTE — Telephone Encounter (Signed)
Routing to PCP for review on what labs should be ordered.

## 2018-01-06 NOTE — Telephone Encounter (Signed)
OK for CMP, lipid, and TSH.  

## 2018-01-06 NOTE — Telephone Encounter (Signed)
Labs ordered. Pt unsure of location for them to be faxed too, so printed copy placed upfront.

## 2018-01-07 ENCOUNTER — Telehealth: Payer: Self-pay | Admitting: Family Medicine

## 2018-01-07 NOTE — Telephone Encounter (Signed)
pts husband called because she has an appt on May 10 with Dr.Metheney and would like to get her lab orders before her appt so she can get her blood work done at [employer] where he works, so please print and leave at [employer] for him to pick up. Thanks

## 2018-01-08 NOTE — Telephone Encounter (Signed)
lvm informing pt that orders are up front.Heath Gold, CMA

## 2018-01-13 ENCOUNTER — Other Ambulatory Visit: Payer: Self-pay | Admitting: Family Medicine

## 2018-01-16 DIAGNOSIS — E118 Type 2 diabetes mellitus with unspecified complications: Secondary | ICD-10-CM | POA: Diagnosis not present

## 2018-01-16 DIAGNOSIS — I10 Essential (primary) hypertension: Secondary | ICD-10-CM | POA: Diagnosis not present

## 2018-01-16 DIAGNOSIS — Z794 Long term (current) use of insulin: Secondary | ICD-10-CM | POA: Diagnosis not present

## 2018-01-16 DIAGNOSIS — Z13220 Encounter for screening for lipoid disorders: Secondary | ICD-10-CM | POA: Diagnosis not present

## 2018-01-17 ENCOUNTER — Ambulatory Visit: Payer: 59 | Admitting: Family Medicine

## 2018-01-17 LAB — COMPREHENSIVE METABOLIC PANEL
A/G RATIO: 1.5 (ref 1.2–2.2)
ALK PHOS: 78 IU/L (ref 39–117)
ALT: 13 IU/L (ref 0–32)
AST: 15 IU/L (ref 0–40)
Albumin: 4.3 g/dL (ref 3.5–5.5)
BUN/Creatinine Ratio: 16 (ref 9–23)
BUN: 14 mg/dL (ref 6–24)
Bilirubin Total: 0.6 mg/dL (ref 0.0–1.2)
CALCIUM: 9.8 mg/dL (ref 8.7–10.2)
CO2: 24 mmol/L (ref 20–29)
CREATININE: 0.85 mg/dL (ref 0.57–1.00)
Chloride: 104 mmol/L (ref 96–106)
GFR calc Af Amer: 87 mL/min/{1.73_m2} (ref >59)
GFR, EST NON AFRICAN AMERICAN: 76 mL/min/{1.73_m2} (ref >59)
GLOBULIN, TOTAL: 2.8 g/dL (ref 1.5–4.5)
Glucose: 90 mg/dL (ref 65–99)
Potassium: 3.9 mmol/L (ref 3.5–5.2)
SODIUM: 143 mmol/L (ref 134–144)
Total Protein: 7.1 g/dL (ref 6.0–8.5)

## 2018-01-17 LAB — TSH: TSH: 1.11 u[IU]/mL (ref 0.450–4.500)

## 2018-01-17 LAB — LIPID PANEL
CHOL/HDL RATIO: 2.1 ratio (ref 0.0–4.4)
Cholesterol, Total: 206 mg/dL — ABNORMAL HIGH (ref 100–199)
HDL: 98 mg/dL (ref >39)
LDL CALC: 81 mg/dL (ref 0–99)
TRIGLYCERIDES: 136 mg/dL (ref 0–149)
VLDL Cholesterol Cal: 27 mg/dL (ref 5–40)

## 2018-01-22 ENCOUNTER — Other Ambulatory Visit: Payer: Self-pay | Admitting: Family Medicine

## 2018-02-04 ENCOUNTER — Encounter: Payer: Self-pay | Admitting: Family Medicine

## 2018-02-04 ENCOUNTER — Other Ambulatory Visit: Payer: Self-pay | Admitting: *Deleted

## 2018-02-04 ENCOUNTER — Ambulatory Visit (INDEPENDENT_AMBULATORY_CARE_PROVIDER_SITE_OTHER): Payer: BLUE CROSS/BLUE SHIELD | Admitting: Family Medicine

## 2018-02-04 VITALS — BP 139/65 | HR 77 | Ht 62.0 in | Wt 181.0 lb

## 2018-02-04 DIAGNOSIS — E118 Type 2 diabetes mellitus with unspecified complications: Secondary | ICD-10-CM

## 2018-02-04 DIAGNOSIS — Z794 Long term (current) use of insulin: Secondary | ICD-10-CM

## 2018-02-04 DIAGNOSIS — I10 Essential (primary) hypertension: Secondary | ICD-10-CM

## 2018-02-04 DIAGNOSIS — F329 Major depressive disorder, single episode, unspecified: Secondary | ICD-10-CM | POA: Diagnosis not present

## 2018-02-04 DIAGNOSIS — F32A Depression, unspecified: Secondary | ICD-10-CM

## 2018-02-04 LAB — POCT UA - MICROALBUMIN
Creatinine, POC: 300 mg/dL
Microalbumin Ur, POC: 150 mg/L

## 2018-02-04 LAB — POCT GLYCOSYLATED HEMOGLOBIN (HGB A1C): Hemoglobin A1C: 9.7 % — AB (ref 4.0–5.6)

## 2018-02-04 MED ORDER — ALCOHOL PREP PADS: MEDICATED_PAD | 4 refills | Status: DC

## 2018-02-04 MED ORDER — ONETOUCH LANCETS MISC: 4 refills | Status: DC

## 2018-02-04 MED ORDER — GLUCOSE BLOOD VI STRP: ORAL_STRIP | 4 refills | Status: DC

## 2018-02-04 MED ORDER — INSULIN PEN NEEDLE 31G X 6 MM MISC: 1 refills | Status: DC

## 2018-02-04 MED ORDER — SERTRALINE HCL 50 MG PO TABS: ORAL_TABLET | ORAL | 1 refills | Status: DC

## 2018-02-04 MED ORDER — AMBULATORY NON FORMULARY MEDICATION
0 refills | Status: AC
Start: 2018-02-04 — End: ?

## 2018-02-04 NOTE — Patient Instructions (Signed)
Increase Lantus to 60 units at bedtime and call with your blood sugars in about 1 week so that I can continue to adjust it. Work on trying to get in regular exercise and continue to watch diet.  Make sure avoiding high carb foods and sweetened foods and beverages.

## 2018-02-04 NOTE — Progress Notes (Signed)
Subjective:    CC:   HPI:  Diabetes - no hypoglycemic events. No wounds or sores that are not healing well. No increased thirst or urination. Checking glucose at home. Taking medications as prescribed without any side effects.  She is currently on 50 units of Lantus.  She admits she has not been on track with exercise or diet.  Hypertension- Pt denies chest pain, SOB, dizziness, or heart palpitations.  Taking meds as directed w/o problems.  Denies medication side effects.    She also admits that she is been feeling down and depressed.  She just not been wanting to get out of the bed during the days and just does not feel motivated.  She denies feeling anxious.  She is currently on Lexapro 10 mg daily but does not feel like it is effective.  When I asked her if she had any thoughts of harming herself she just said she really did not want to answer that question but her husband was here with her for the office visit.  Past medical history, Surgical history, Family history not pertinant except as noted below, Social history, Allergies, and medications have been entered into the medical record, reviewed, and corrections made.   Review of Systems: No fevers, chills, night sweats, weight loss, chest pain, or shortness of breath.   Objective:    General: Well Developed, well nourished, and in no acute distress.  Neuro: Alert and oriented x3, extra-ocular muscles intact, sensation grossly intact.  HEENT: Normocephalic, atraumatic  Skin: Warm and dry, no rashes. Cardiac: Regular rate and rhythm, no murmurs rubs or gallops, no lower extremity edema.  Respiratory: Clear to auscultation bilaterally. Not using accessory muscles, speaking in full sentences.   Impression and Recommendations:    DM -uncontrolled.  Discussed results.  Will increase Lantus to 60 units and have her call me in a week with her blood test results. Lab Results  Component Value Date   HGBA1C 9.7 (A) 02/04/2018    HTN  -pressure borderline today.  Again is one her to work on diet exercise and weight loss and I think her blood pressure will go back down to normal without is having to adjust her medication but if it still elevated when I see her back then we will need to adjust her regimen.  Depression-uncontrolled.  PHQ 9 score of 6 today.  Discussed options.  Will discontinue Lexapro and try switching her to fluoxetine.  We will also refer her to therapy/counseling.  She is open to doing some counseling.  Follow-up in 6 weeks.

## 2018-02-07 ENCOUNTER — Other Ambulatory Visit: Payer: Self-pay | Admitting: *Deleted

## 2018-02-07 DIAGNOSIS — Z794 Long term (current) use of insulin: Secondary | ICD-10-CM

## 2018-02-07 DIAGNOSIS — E118 Type 2 diabetes mellitus with unspecified complications: Secondary | ICD-10-CM

## 2018-02-07 MED ORDER — GLUCOSE BLOOD VI STRP: ORAL_STRIP | 4 refills | Status: DC

## 2018-02-07 MED ORDER — INSULIN PEN NEEDLE 31G X 6 MM MISC: 1 refills | Status: DC

## 2018-02-07 MED ORDER — ONETOUCH LANCETS MISC: 4 refills | Status: DC

## 2018-02-12 DIAGNOSIS — Z8673 Personal history of transient ischemic attack (TIA), and cerebral infarction without residual deficits: Secondary | ICD-10-CM | POA: Diagnosis not present

## 2018-02-12 DIAGNOSIS — I69320 Aphasia following cerebral infarction: Secondary | ICD-10-CM | POA: Diagnosis not present

## 2018-02-12 DIAGNOSIS — R4701 Aphasia: Secondary | ICD-10-CM | POA: Diagnosis not present

## 2018-02-19 DIAGNOSIS — R4701 Aphasia: Secondary | ICD-10-CM | POA: Diagnosis not present

## 2018-02-19 DIAGNOSIS — I69320 Aphasia following cerebral infarction: Secondary | ICD-10-CM | POA: Diagnosis not present

## 2018-02-19 DIAGNOSIS — Z8673 Personal history of transient ischemic attack (TIA), and cerebral infarction without residual deficits: Secondary | ICD-10-CM | POA: Diagnosis not present

## 2018-02-26 DIAGNOSIS — R4701 Aphasia: Secondary | ICD-10-CM | POA: Diagnosis not present

## 2018-02-26 DIAGNOSIS — I69320 Aphasia following cerebral infarction: Secondary | ICD-10-CM | POA: Diagnosis not present

## 2018-02-26 DIAGNOSIS — Z8673 Personal history of transient ischemic attack (TIA), and cerebral infarction without residual deficits: Secondary | ICD-10-CM | POA: Diagnosis not present

## 2018-03-12 DIAGNOSIS — I69320 Aphasia following cerebral infarction: Secondary | ICD-10-CM | POA: Diagnosis not present

## 2018-03-14 ENCOUNTER — Ambulatory Visit: Payer: BLUE CROSS/BLUE SHIELD | Admitting: Family Medicine

## 2018-03-19 DIAGNOSIS — I69320 Aphasia following cerebral infarction: Secondary | ICD-10-CM | POA: Diagnosis not present

## 2018-03-20 ENCOUNTER — Other Ambulatory Visit: Payer: Self-pay

## 2018-03-20 MED ORDER — METFORMIN HCL ER 500 MG PO TB24: 500.0000 mg | ORAL_TABLET | Freq: Every day | ORAL | 1 refills | Status: DC

## 2018-03-20 MED ORDER — ATORVASTATIN CALCIUM 80 MG PO TABS: 80.0000 mg | ORAL_TABLET | Freq: Every day | ORAL | 3 refills | Status: DC

## 2018-03-26 DIAGNOSIS — I69320 Aphasia following cerebral infarction: Secondary | ICD-10-CM | POA: Diagnosis not present

## 2018-03-27 ENCOUNTER — Encounter: Payer: Self-pay | Admitting: Family Medicine

## 2018-03-27 ENCOUNTER — Ambulatory Visit (INDEPENDENT_AMBULATORY_CARE_PROVIDER_SITE_OTHER): Payer: BLUE CROSS/BLUE SHIELD | Admitting: Family Medicine

## 2018-03-27 VITALS — BP 150/78 | HR 84 | Ht 62.0 in | Wt 183.0 lb

## 2018-03-27 DIAGNOSIS — Z6833 Body mass index (BMI) 33.0-33.9, adult: Secondary | ICD-10-CM

## 2018-03-27 DIAGNOSIS — I10 Essential (primary) hypertension: Secondary | ICD-10-CM | POA: Diagnosis not present

## 2018-03-27 DIAGNOSIS — I69320 Aphasia following cerebral infarction: Secondary | ICD-10-CM | POA: Diagnosis not present

## 2018-03-27 DIAGNOSIS — F329 Major depressive disorder, single episode, unspecified: Secondary | ICD-10-CM

## 2018-03-27 DIAGNOSIS — F32A Depression, unspecified: Secondary | ICD-10-CM

## 2018-03-27 NOTE — Progress Notes (Signed)
Mood-

## 2018-03-27 NOTE — Progress Notes (Signed)
Subjective:    Patient ID: Sabrina Dougherty, female    DOB: 03-11-1961, 57 y.o.   MRN: [id number]  HPI 57 year old female is here today for follow-up depression.  When I last saw her about 7 weeks ago for diabetes she had expressed concerns that she was feeling down and depressed and just not wanting to get out of bed most days and feeling very unmotivated.  She did not feel anxious. We decided to switch her from lexapro.  She never started the zoloft.  In fact she actually got more Lexapro in from her mail order so just decided to stay on it until this last week.  Aphasia of stroke -She has been going to speech therapy he had 6 therapy sessions.  BMI 33-brought in a supplemental enzyme that she wants to be able to take for weight loss.  Review of Systems  BP (!) 150/78   Pulse 84   Ht 5' 2"  (1.575 m)   Wt 183 lb (83 kg)   SpO2 100%   BMI 33.47 kg/m     Allergies  Allergen Reactions  . Ace Inhibitors   . Amoxicillin-Pot Clavulanate     REACTION: skin peeling  . Fluoxetine Other (See Comments)    sedation  . Propoxyphene Hcl     Past Medical History:  Diagnosis Date  . Diabetes mellitus   . Pancreatitis   . Stroke [facility]) 1990s   left middle cerebral artery stroke  . Stroke ([place]) 02/2012   left temporal infarct    No past surgical history on file.  Social History   Socioeconomic History  . Marital status: Married    Spouse name: Not on file  . Number of children: Not on file  . Years of education: Not on file  . Highest education level: Not on file  Occupational History  . Not on file  Social Needs  . Financial resource strain: Not on file  . Food insecurity:    Worry: Not on file    Inability: Not on file  . Transportation needs:    Medical: Not on file    Non-medical: Not on file  Tobacco Use  . Smoking status: Former Smoker    Packs/day: 0.10    Types: Cigarettes    Last attempt to quit: 01/13/2012    Years since quitting: 6.2  . Smokeless  tobacco: Never Used  Substance and Sexual Activity  . Alcohol use: Yes    Alcohol/week: 1.8 oz    Types: 3 Standard drinks or equivalent per week  . Drug use: No  . Sexual activity: Yes  Lifestyle  . Physical activity:    Days per week: Not on file    Minutes per session: Not on file  . Stress: Not on file  Relationships  . Social connections:    Talks on phone: Not on file    Gets together: Not on file    Attends religious service: Not on file    Active member of club or organization: Not on file    Attends meetings of clubs or organizations: Not on file    Relationship status: Not on file  . Intimate partner violence:    Fear of current or ex partner: Not on file    Emotionally abused: Not on file    Physically abused: Not on file    Forced sexual activity: Not on file  Other Topics Concern  . Not on file  Social History Narrative   Lives with  her husband and daughter who is disabled. Exercises some.     Family History  Problem Relation Age of Onset  . Microcephaly Daughter        Seckel syndrome  . Alcohol abuse Mother   . Diabetes Sister     Outpatient Encounter Medications as of 03/27/2018  Medication Sig  . Alcohol Swabs (ALCOHOL PREP) PADS For use when checking daily blood sugars.  . AMBULATORY NON FORMULARY MEDICATION Medication Name: One touch Verio Flex kit DX:E11.9 For testing 3 times a day  . amLODipine (NORVASC) 10 MG tablet Take 1 tablet (10 mg total) by mouth daily. Must keep OV  . aspirin 81 MG tablet Take 81 mg by mouth daily.  Marland Kitchen atorvastatin (LIPITOR) 80 MG tablet Take 1 tablet (80 mg total) by mouth daily at 6 PM.  . clopidogrel (PLAVIX) 75 MG tablet Take 1 tablet (75 mg total) by mouth daily. Must keep OV  . glucose blood (ACCU-CHEK AVIVA PLUS) test strip For testing blood sugars 3 times daily. Dx:E11.9  . ibuprofen (ADVIL,MOTRIN) 600 MG tablet take 1 tablet by mouth every 6 to 8 hours if needed  . Insulin Pen Needle (1ST TIER UNIFINE PENTIPS) 31G  X 6 MM MISC For use when injecting insulin. Dx: E11.9  . Insulin Syringes, Disposable, U-100 0.5 ML MISC Use to inject insulin.  DX : E11.9  . LANTUS 100 UNIT/ML injection INJECT SUBCUTANEOUSLY 60  UNITS AT BEDTIME  . metFORMIN (GLUCOPHAGE-XR) 500 MG 24 hr tablet Take 1 tablet (500 mg total) by mouth daily with breakfast.  . metoprolol succinate (TOPROL-XL) 100 MG 24 hr tablet Take 1 tablet (100 mg total) by mouth daily.  . milk thistle 175 MG tablet Take 175 mg by mouth daily.  . ONE TOUCH LANCETS MISC Check blood sugar 3 times daily. One touch delica lancet 45X Dx: M46.8  . sertraline (ZOLOFT) 50 MG tablet 1/2 tab po QD x 6 days.  Then increase to whole tab daily. (Patient not taking: Reported on 03/27/2018)  . [DISCONTINUED] escitalopram (LEXAPRO) 10 MG tablet Take 10 mg by mouth daily.   No facility-administered encounter medications on file as of 03/27/2018.        Objective:   Physical Exam  Constitutional: She is oriented to person, place, and time. She appears well-developed and well-nourished.  HENT:  Head: Normocephalic and atraumatic.  Eyes: Conjunctivae and EOM are normal.  Pulmonary/Chest: Effort normal.  Neurological: She is alert and oriented to person, place, and time.  Skin: Skin is warm and dry. No pallor.  Psychiatric: She has a normal mood and affect. Her behavior is normal.  Vitals reviewed.         Assessment & Plan:  Depression -continue Lexapro since not effective and encouraged her to pick up the prescription for the sertraline.  And start the new medication.  I will see her back in about 6 weeks for her diabetes so we can see how well she is doing on the medication.  She is still struggling with motivation and getting out of bed and feeling down and sad and depressed.  Aphasia as late effect of stroke- continuing with ST.    BMI 33/obesity-okay to take supplement but I did encourage her to check her blood sugar little bit more frequently the first couple of  weeks just to make sure it is not affecting her blood sugar.  Also encouraged her to get back into the gym.  HTN -pressure was uncontrolled today, it was better  when I saw her about 6 weeks ago so she is coming back again again in about 6 weeks we will recheck at that time if it still elevated then we may need to add something to her medication regimen.  She is intolerant to ACE inhibitors and is already on the max dose of amlodipine and metoprolol.

## 2018-03-27 NOTE — Patient Instructions (Signed)
Please start the Zoloft.

## 2018-04-01 ENCOUNTER — Other Ambulatory Visit: Payer: Self-pay

## 2018-04-01 DIAGNOSIS — I10 Essential (primary) hypertension: Secondary | ICD-10-CM

## 2018-04-01 MED ORDER — METOPROLOL SUCCINATE ER 100 MG PO TB24: 100.0000 mg | ORAL_TABLET | Freq: Every day | ORAL | 1 refills | Status: DC

## 2018-04-01 MED ORDER — AMLODIPINE BESYLATE 10 MG PO TABS: 10.0000 mg | ORAL_TABLET | Freq: Every day | ORAL | 1 refills | Status: DC

## 2018-04-02 DIAGNOSIS — I69320 Aphasia following cerebral infarction: Secondary | ICD-10-CM | POA: Diagnosis not present

## 2018-04-09 DIAGNOSIS — I69320 Aphasia following cerebral infarction: Secondary | ICD-10-CM | POA: Diagnosis not present

## 2018-04-16 DIAGNOSIS — I69320 Aphasia following cerebral infarction: Secondary | ICD-10-CM | POA: Diagnosis not present

## 2018-04-23 DIAGNOSIS — I69320 Aphasia following cerebral infarction: Secondary | ICD-10-CM | POA: Diagnosis not present

## 2018-04-30 DIAGNOSIS — I69320 Aphasia following cerebral infarction: Secondary | ICD-10-CM | POA: Diagnosis not present

## 2018-05-08 DIAGNOSIS — I69320 Aphasia following cerebral infarction: Secondary | ICD-10-CM | POA: Diagnosis not present

## 2018-05-15 DIAGNOSIS — I69320 Aphasia following cerebral infarction: Secondary | ICD-10-CM | POA: Diagnosis not present

## 2018-05-16 ENCOUNTER — Telehealth: Payer: Self-pay

## 2018-05-16 DIAGNOSIS — Z8673 Personal history of transient ischemic attack (TIA), and cerebral infarction without residual deficits: Secondary | ICD-10-CM

## 2018-05-16 NOTE — Telephone Encounter (Signed)
Faxed new order to [facility] for Speech therapy.

## 2018-05-29 DIAGNOSIS — I69320 Aphasia following cerebral infarction: Secondary | ICD-10-CM | POA: Diagnosis not present

## 2018-06-05 ENCOUNTER — Ambulatory Visit (INDEPENDENT_AMBULATORY_CARE_PROVIDER_SITE_OTHER): Payer: BLUE CROSS/BLUE SHIELD | Admitting: Family Medicine

## 2018-06-05 ENCOUNTER — Encounter: Payer: Self-pay | Admitting: Family Medicine

## 2018-06-05 VITALS — BP 145/64 | HR 74 | Ht 62.0 in | Wt 181.0 lb

## 2018-06-05 DIAGNOSIS — F329 Major depressive disorder, single episode, unspecified: Secondary | ICD-10-CM | POA: Diagnosis not present

## 2018-06-05 DIAGNOSIS — Z794 Long term (current) use of insulin: Secondary | ICD-10-CM | POA: Diagnosis not present

## 2018-06-05 DIAGNOSIS — I10 Essential (primary) hypertension: Secondary | ICD-10-CM | POA: Diagnosis not present

## 2018-06-05 DIAGNOSIS — F32A Depression, unspecified: Secondary | ICD-10-CM

## 2018-06-05 DIAGNOSIS — E118 Type 2 diabetes mellitus with unspecified complications: Secondary | ICD-10-CM | POA: Diagnosis not present

## 2018-06-05 LAB — POCT GLYCOSYLATED HEMOGLOBIN (HGB A1C): HEMOGLOBIN A1C: 10.4 % — AB (ref 4.0–5.6)

## 2018-06-05 NOTE — Progress Notes (Addendum)
Subjective:    CC: DM, BP  HPI:  Diabetes - no hypoglycemic events. No wounds or sores that are not healing well. No increased thirst or urination. Checking glucose at home. Taking medications as prescribed without any side effects.  She is really been struggling with her diet she is been eating a lot of sweets like cakes cookies etc.  She said she has been good about staying away from the soda.  She has not been exercising regularly.  Hypertension- Pt denies chest pain, SOB, dizziness, or heart palpitations.  Taking meds as directed w/o problems.  Denies medication side effects.    Follow-up depression-when I last saw her we had recently switched her from Lexapro to sertraline.  She had not filled the prescription yet and had not started it and encouraged her to come back when she started the medication.  She was really struggling with motivation to get up and get her day going to get out of bed.  She was definitely feeling sad and down.  Overall she feels like the sertraline is working much better than the Lexapro.  In fact she is very happy with this regimen.  She had a few headaches the first week that she started it but says that has actually resolved.  She still continuing to get additional speech therapy.  She feels like it has been really helpful with her speech issues that she is had since having her stroke.  Past medical history, Surgical history, Family history not pertinant except as noted below, Social history, Allergies, and medications have been entered into the medical record, reviewed, and corrections made.   Review of Systems: No fevers, chills, night sweats, weight loss, chest pain, or shortness of breath.   Objective:    General: Well Developed, well nourished, and in no acute distress.  Neuro: Alert and oriented x3, extra-ocular muscles intact, sensation grossly intact.  HEENT: Normocephalic, atraumatic  Skin: Warm and dry, no rashes. Cardiac: Regular rate and rhythm, no  murmurs rubs or gallops, no lower extremity edema.  Respiratory: Clear to auscultation bilaterally. Not using accessory muscles, speaking in full sentences.   Impression and Recommendations:    DM -uncontrolled.  Hemoglobin A1c quite elevated again today.  We discussed options.  She reports that she is taking 55 units of Lantus daily and is using the vials.  I showed her a demo pen for the insulin pens so she could see if she thought she might be interested.  I gave her sample of Toujeo to try at home over the next for 5 days.  If she likes that and we can send a new prescription for EpiPen whether it is a Lantus pen or Toujeo pen.  I do question how consistent she is with her insulin.  We also discussed the importance of cutting back on carbs and making some better dietary choices.  She is just really struggling with this.  HTN - Uncontrolled. Plan to recheck at followup with a nurse visit in 1-2 weeks.    Acute recurrent depression-overall is actually doing much better.  She is been tolerating the sertraline well without any side effects or problems.  She is happy with her current dose.  In fact her PHQ 9 score is 0 today and her gad 7 score is 1.  We will continue with current regimen.  Reminded her to schedule her mammogram.

## 2018-06-05 NOTE — Patient Instructions (Signed)
Toujeo Pen. Inject 55 units nightly. If you like a pen then let us know and we can send a new script and needles.

## 2018-06-13 ENCOUNTER — Other Ambulatory Visit: Payer: Self-pay

## 2018-06-13 DIAGNOSIS — F329 Major depressive disorder, single episode, unspecified: Secondary | ICD-10-CM

## 2018-06-13 DIAGNOSIS — F32A Depression, unspecified: Secondary | ICD-10-CM

## 2018-06-13 MED ORDER — SERTRALINE HCL 50 MG PO TABS: 50.0000 mg | ORAL_TABLET | Freq: Every day | ORAL | 1 refills | Status: DC

## 2018-06-13 MED ORDER — SERTRALINE HCL 50 MG PO TABS: ORAL_TABLET | ORAL | 1 refills | Status: DC

## 2018-06-16 ENCOUNTER — Other Ambulatory Visit: Payer: Self-pay

## 2018-06-16 MED ORDER — INSULIN SYRINGES (DISPOSABLE) U-100 0.5 ML MISC: 99 refills | Status: DC

## 2018-07-03 ENCOUNTER — Other Ambulatory Visit: Payer: Self-pay

## 2018-07-03 MED ORDER — CLOPIDOGREL BISULFATE 75 MG PO TABS: 75.0000 mg | ORAL_TABLET | Freq: Every day | ORAL | 0 refills | Status: DC

## 2018-08-22 ENCOUNTER — Other Ambulatory Visit: Payer: Self-pay

## 2018-08-22 MED ORDER — METFORMIN HCL ER 500 MG PO TB24: 500.0000 mg | ORAL_TABLET | Freq: Every day | ORAL | 1 refills | Status: DC

## 2018-09-01 ENCOUNTER — Ambulatory Visit (INDEPENDENT_AMBULATORY_CARE_PROVIDER_SITE_OTHER): Payer: BLUE CROSS/BLUE SHIELD | Admitting: Family Medicine

## 2018-09-01 ENCOUNTER — Encounter: Payer: Self-pay | Admitting: Family Medicine

## 2018-09-01 VITALS — BP 174/68 | HR 81 | Ht 62.0 in | Wt 185.0 lb

## 2018-09-01 DIAGNOSIS — E118 Type 2 diabetes mellitus with unspecified complications: Secondary | ICD-10-CM

## 2018-09-01 DIAGNOSIS — I10 Essential (primary) hypertension: Secondary | ICD-10-CM

## 2018-09-01 DIAGNOSIS — Z1231 Encounter for screening mammogram for malignant neoplasm of breast: Secondary | ICD-10-CM

## 2018-09-01 LAB — POCT GLYCOSYLATED HEMOGLOBIN (HGB A1C): Hemoglobin A1C: 8.1 % — AB (ref 4.0–5.6)

## 2018-09-01 MED ORDER — AMLODIPINE BESYLATE 10 MG PO TABS: 10.0000 mg | ORAL_TABLET | Freq: Every day | ORAL | 1 refills | Status: DC

## 2018-09-01 MED ORDER — INSULIN GLARGINE 100 UNIT/ML ~~LOC~~ SOLN: SUBCUTANEOUS | 2 refills | Status: DC

## 2018-09-01 MED ORDER — CLOPIDOGREL BISULFATE 75 MG PO TABS: 75.0000 mg | ORAL_TABLET | Freq: Every day | ORAL | 0 refills | Status: DC

## 2018-09-01 MED ORDER — METOPROLOL SUCCINATE ER 100 MG PO TB24: 100.0000 mg | ORAL_TABLET | Freq: Every day | ORAL | 1 refills | Status: DC

## 2018-09-01 NOTE — Progress Notes (Signed)
Subjective:    CC: BP and DM   HPI:  Hypertension- Pt denies chest pain, SOB, dizziness, or heart palpitations.  Taking meds as directed w/o problems.  Denies medication side effects.    Diabetes - no hypoglycemic events. No wounds or sores that are not healing well. No increased thirst or urination. Checking glucose at home. Taking medications as prescribed without any side effects. Still on 60 units of insulin.   She declines sertraline and declines to fill out the mood questionnaire.  Her daughter Carollee HerterShannon with special needs died earlier this months.    Past medical history, Surgical history, Family history not pertinant except as noted below, Social history, Allergies, and medications have been entered into the medical record, reviewed, and corrections made.   Review of Systems: No fevers, chills, night sweats, weight loss, chest pain, or shortness of breath.   Objective:    General: Well Developed, well nourished, and in no acute distress.  Neuro: Alert and oriented x3, extra-ocular muscles intact, sensation grossly intact.  HEENT: Normocephalic, atraumatic  Skin: Warm and dry, no rashes. Cardiac: Regular rate and rhythm, no murmurs rubs or gallops, no lower extremity edema.  Respiratory: Clear to auscultation bilaterally. Not using accessory muscles, speaking in full sentences.   Impression and Recommendations:    HTN - Uncontrolled.  Just encouraged her to make sure she is taking her blood pressures.  Follow-up in 2 weeks for repeat pressure check with nurse visit.  DM - much improved, but still not at goal.  We discussed options including increasing the metformin she really wants to hold off and continue to work on diet and exercise.  Follow back up in 3 months.  Grief -call if she is having any problems or if it she is struggling emotionally I be happy to refer her for any grief therapy or counseling.  Her husband was here with her today and seems supportive.

## 2018-09-16 ENCOUNTER — Telehealth: Payer: Self-pay | Admitting: Family Medicine

## 2018-09-16 DIAGNOSIS — I69320 Aphasia following cerebral infarction: Secondary | ICD-10-CM

## 2018-09-16 NOTE — Telephone Encounter (Signed)
Dr Linford Arnold   [facility] Speech called and they need a new Speech therapy order please place and I will fax  Thank you Arline Asp

## 2018-09-17 NOTE — Telephone Encounter (Signed)
Order/referral placed.Heath Gold, CMA

## 2018-09-17 NOTE — Telephone Encounter (Signed)
Order faxed - [facility]

## 2018-09-19 ENCOUNTER — Ambulatory Visit (INDEPENDENT_AMBULATORY_CARE_PROVIDER_SITE_OTHER): Payer: BLUE CROSS/BLUE SHIELD | Admitting: Family Medicine

## 2018-09-19 VITALS — BP 148/58 | HR 62 | Temp 98.8°F | Wt 182.0 lb

## 2018-09-19 DIAGNOSIS — I10 Essential (primary) hypertension: Secondary | ICD-10-CM

## 2018-09-19 MED ORDER — HYDROCHLOROTHIAZIDE 12.5 MG PO CAPS: 12.5000 mg | ORAL_CAPSULE | Freq: Every day | ORAL | 0 refills | Status: DC

## 2018-09-19 NOTE — Progress Notes (Signed)
Agree with documentation as above.   Mckinsley Koelzer, MD  

## 2018-09-19 NOTE — Progress Notes (Signed)
Patient in today for BP check. BP at last visit was 174/68. Patient reports she is taking her BP medication. BP in the office today is148/58. Pt denies headaches, blurred vision, chest pain and shortness of breath. Per Provider a new rx for 12.5mg  of HCTZ will be called to pharmacy and pt is to add this to her current regimen. Pt will return to clinic in 3 weeks for nurse visit.

## 2018-10-10 ENCOUNTER — Ambulatory Visit: Payer: BLUE CROSS/BLUE SHIELD

## 2018-11-04 ENCOUNTER — Encounter (HOSPITAL_BASED_OUTPATIENT_CLINIC_OR_DEPARTMENT_OTHER): Payer: Self-pay

## 2018-11-04 ENCOUNTER — Emergency Department (HOSPITAL_BASED_OUTPATIENT_CLINIC_OR_DEPARTMENT_OTHER)
Admission: EM | Admit: 2018-11-04 | Discharge: 2018-11-04 | Disposition: A | Discharge status: Home / Self Care | Payer: Managed Care, Other (non HMO) | Attending: Emergency Medicine | Admitting: Emergency Medicine

## 2018-11-04 ENCOUNTER — Emergency Department (HOSPITAL_BASED_OUTPATIENT_CLINIC_OR_DEPARTMENT_OTHER): Payer: Managed Care, Other (non HMO)

## 2018-11-04 ENCOUNTER — Other Ambulatory Visit: Payer: Self-pay

## 2018-11-04 DIAGNOSIS — Z87891 Personal history of nicotine dependence: Secondary | ICD-10-CM | POA: Diagnosis not present

## 2018-11-04 DIAGNOSIS — Z794 Long term (current) use of insulin: Secondary | ICD-10-CM | POA: Diagnosis not present

## 2018-11-04 DIAGNOSIS — Z8673 Personal history of transient ischemic attack (TIA), and cerebral infarction without residual deficits: Secondary | ICD-10-CM | POA: Insufficient documentation

## 2018-11-04 DIAGNOSIS — Z7982 Long term (current) use of aspirin: Secondary | ICD-10-CM | POA: Insufficient documentation

## 2018-11-04 DIAGNOSIS — Z7902 Long term (current) use of antithrombotics/antiplatelets: Secondary | ICD-10-CM | POA: Diagnosis not present

## 2018-11-04 DIAGNOSIS — Z79899 Other long term (current) drug therapy: Secondary | ICD-10-CM | POA: Diagnosis not present

## 2018-11-04 DIAGNOSIS — I10 Essential (primary) hypertension: Secondary | ICD-10-CM | POA: Insufficient documentation

## 2018-11-04 DIAGNOSIS — R05 Cough: Secondary | ICD-10-CM | POA: Diagnosis present

## 2018-11-04 DIAGNOSIS — N390 Urinary tract infection, site not specified: Secondary | ICD-10-CM | POA: Diagnosis not present

## 2018-11-04 DIAGNOSIS — E119 Type 2 diabetes mellitus without complications: Secondary | ICD-10-CM | POA: Insufficient documentation

## 2018-11-04 DIAGNOSIS — B9789 Other viral agents as the cause of diseases classified elsewhere: Secondary | ICD-10-CM

## 2018-11-04 DIAGNOSIS — J069 Acute upper respiratory infection, unspecified: Secondary | ICD-10-CM

## 2018-11-04 LAB — URINALYSIS, ROUTINE W REFLEX MICROSCOPIC
Bilirubin Urine: NEGATIVE
Glucose, UA: NEGATIVE mg/dL
Hgb urine dipstick: NEGATIVE
Ketones, ur: NEGATIVE mg/dL
LEUKOCYTE UA: NEGATIVE
Nitrite: NEGATIVE
Protein, ur: >300 mg/dL — AB
Specific Gravity, Urine: 1.025 (ref 1.005–1.030)
pH: 6 (ref 5.0–8.0)

## 2018-11-04 LAB — URINALYSIS, MICROSCOPIC (REFLEX)

## 2018-11-04 MED ORDER — BENZONATATE 100 MG PO CAPS: 100.0000 mg | ORAL_CAPSULE | Freq: Three times a day (TID) | ORAL | 0 refills | Status: DC

## 2018-11-04 MED ORDER — BENZONATATE 100 MG PO CAPS
200.0000 mg | ORAL_CAPSULE | Freq: Once | ORAL | Status: AC
  Administered 2018-11-04: 200 mg via ORAL
  Filled 2018-11-04: qty 2

## 2018-11-04 MED ORDER — SULFAMETHOXAZOLE-TRIMETHOPRIM 800-160 MG PO TABS
1.0000 | ORAL_TABLET | Freq: Once | ORAL | Status: AC
  Administered 2018-11-04: 1 via ORAL
  Filled 2018-11-04: qty 1

## 2018-11-04 MED ORDER — SULFAMETHOXAZOLE-TRIMETHOPRIM 800-160 MG PO TABS: 1.0000 | ORAL_TABLET | Freq: Two times a day (BID) | ORAL | 0 refills | Status: DC

## 2018-11-04 MED ORDER — ACETAMINOPHEN 500 MG PO TABS
1000.0000 mg | ORAL_TABLET | Freq: Once | ORAL | Status: AC
  Administered 2018-11-04: 1000 mg via ORAL
  Filled 2018-11-04: qty 2

## 2018-11-04 NOTE — ED Triage Notes (Signed)
Pt presents with SOB, eyes watering, congestion and body aches. Symptoms x 5 days. Abdominal pain began today. Denies fevers

## 2018-11-04 NOTE — ED Notes (Signed)
Pt had hx of stroke- able to write better then communicate by speech.

## 2018-11-04 NOTE — ED Provider Notes (Signed)
[place] EMERGENCY DEPARTMENT Provider Note   CSN: [id number] Arrival date & time: 11/04/18  0018    History   Chief Complaint Chief Complaint  Patient presents with  . URI    HPI Sabrina Dougherty is a 58 y.o. female.     The history is provided by the patient.  URI  Presenting symptoms: congestion, cough and rhinorrhea   Presenting symptoms: no fever   Presenting symptoms comment:  Body aches and hurts in abdomen with cough.  Congestion:    Location:  Nasal   Interferes with sleep: no     Interferes with eating/drinking: no   Cough:    Cough characteristics:  Non-productive   Severity:  Mild   Onset quality:  Gradual   Duration:  5 days   Timing:  Sporadic   Progression:  Unchanged   Chronicity:  New Severity:  Mild Onset quality:  Gradual Duration:  5 days Timing:  Sporadic Chronicity:  New Relieved by:  Nothing Worsened by:  Nothing Ineffective treatments:  None tried Associated symptoms: myalgias   Associated symptoms: no arthralgias, no headaches, no neck pain, no swollen glands and no wheezing   Risk factors: no immunosuppression   No f/c/r.  No periumbilical pain.  Hurts to cough.  Patient is mostly concerned about the cough.  Pain is worse today.  No n/v/d.  No change in appetite.  No flank pain.    Past Medical History:  Diagnosis Date  . Diabetes mellitus   . Pancreatitis   . Stroke [facility]) 1990s   left middle cerebral artery stroke  . Stroke [facility]) 02/2012   left temporal infarct    Patient Active Problem List   Diagnosis Date Noted  . Aphasia S/P CVA 12/26/2015  . Depression, acute 12/26/2015  . History of stroke with residual deficit 06/22/2015  . Type 2 diabetes mellitus with complications ([place]) 36/64/4034  . DDD (degenerative disc disease), cervical 11/25/2013  . Smoker 02/28/2012  . ANXIETY STATE, UNSPECIFIED 07/27/2009  . PERIMENOPAUSAL STATUS 04/12/2008  . HYPERTENSION, BENIGN SYSTEMIC 06/18/2006    History  reviewed. No pertinent surgical history.   OB History   No obstetric history on file.      Home Medications    Prior to Admission medications   Medication Sig Start Date End Date Taking? Authorizing Provider  Alcohol Swabs (ALCOHOL PREP) PADS For use when checking daily blood sugars. 02/04/18   Hali Marry, MD  AMBULATORY NON FORMULARY MEDICATION Medication Name: One touch Verio Flex kit DX:E11.9 For testing 3 times a day 02/04/18   Hali Marry, MD  amLODipine (NORVASC) 10 MG tablet Take 1 tablet (10 mg total) by mouth daily. 09/01/18   Hali Marry, MD  aspirin 81 MG tablet Take 81 mg by mouth daily.    [provider]  atorvastatin (LIPITOR) 80 MG tablet Take 1 tablet (80 mg total) by mouth daily at 6 PM. 03/20/18   Metheney, Rene Kocher, MD  benzonatate (TESSALON) 100 MG capsule Take 1 capsule (100 mg total) by mouth every 8 (eight) hours. 11/04/18   Jian Hodgman, MD  clopidogrel (PLAVIX) 75 MG tablet Take 1 tablet (75 mg total) by mouth daily. 09/01/18   Hali Marry, MD  glucose blood (ACCU-CHEK AVIVA PLUS) test strip For testing blood sugars 3 times daily. Dx:E11.9 02/07/18   Hali Marry, MD  hydrochlorothiazide (MICROZIDE) 12.5 MG capsule Take 1 capsule (12.5 mg total) by mouth daily. 09/19/18   Beatrice Lecher  D, MD  ibuprofen (ADVIL,MOTRIN) 600 MG tablet take 1 tablet by mouth every 6 to 8 hours if needed 12/04/16   Hali Marry, MD  insulin glargine (LANTUS) 100 UNIT/ML injection INJECT SUBCUTANEOUSLY 60  UNITS AT BEDTIME 09/01/18   Hali Marry, MD  Insulin Pen Needle (1ST TIER UNIFINE PENTIPS) 31G X 6 MM MISC For use when injecting insulin. Dx: E11.9 02/07/18   Hali Marry, MD  Insulin Syringes, Disposable, U-100 0.5 ML MISC Use to inject 60 units of insulin at bedtime.  DX : E11.9 06/16/18   Hali Marry, MD  metFORMIN (GLUCOPHAGE-XR) 500 MG 24 hr tablet Take 1 tablet (500 mg total) by  mouth daily with breakfast. 08/22/18   Hali Marry, MD  metoprolol succinate (TOPROL-XL) 100 MG 24 hr tablet Take 1 tablet (100 mg total) by mouth daily. 09/01/18   Hali Marry, MD  milk thistle 175 MG tablet Take 175 mg by mouth daily.    [provider]  ONE TOUCH LANCETS MISC Check blood sugar 3 times daily. One touch delica lancet 97D Dx: Z32.9 02/07/18   Hali Marry, MD  sulfamethoxazole-trimethoprim (BACTRIM DS,SEPTRA DS) 800-160 MG tablet Take 1 tablet by mouth 2 (two) times daily for 7 days. 11/04/18 11/11/18  Rashied Corallo, MD    Family History Family History  Problem Relation Age of Onset  . Microcephaly Daughter        Seckel syndrome  . Alcohol abuse Mother   . Diabetes Sister     Social History Social History   Tobacco Use  . Smoking status: Former Smoker    Packs/day: 0.10    Types: Cigarettes    Last attempt to quit: 01/13/2012    Years since quitting: 6.8  . Smokeless tobacco: Never Used  Substance Use Topics  . Alcohol use: Yes    Alcohol/week: 3.0 standard drinks    Types: 3 Standard drinks or equivalent per week  . Drug use: No     Allergies   Ace inhibitors; Amoxicillin-pot clavulanate; Fluoxetine; and Propoxyphene hcl   Review of Systems Review of Systems  Constitutional: Negative for fever.  HENT: Positive for congestion and rhinorrhea.   Eyes: Negative for visual disturbance.  Respiratory: Positive for cough. Negative for chest tightness, shortness of breath and wheezing.   Cardiovascular: Negative for chest pain, palpitations and leg swelling.  Gastrointestinal: Negative for constipation, diarrhea, nausea and vomiting.  Genitourinary: Negative for flank pain.  Musculoskeletal: Positive for myalgias. Negative for arthralgias and neck pain.  Neurological: Negative for headaches.  All other systems reviewed and are negative.    Physical Exam Updated Vital Signs BP (!) 157/93 (BP Location: Right Arm)   Pulse  91   Temp 98.6 F (37 C) (Oral)   Resp 17   Ht 5' 3"  (1.6 m)   Wt 81.6 kg   LMP 07/10/2011   SpO2 99%   BMI 31.89 kg/m   Physical Exam Vitals signs and nursing note reviewed.  Constitutional:      General: She is not in acute distress.    Appearance: She is obese. She is not toxic-appearing.     Comments: Smiling well appearing  HENT:     Head: Normocephalic and atraumatic.     Nose: Nose normal.     Mouth/Throat:     Mouth: Mucous membranes are moist.     Pharynx: Oropharynx is clear.  Eyes:     Pupils: Pupils are equal, round, and reactive to  light.  Neck:     Musculoskeletal: Normal range of motion and neck supple.  Cardiovascular:     Rate and Rhythm: Normal rate and regular rhythm.     Pulses: Normal pulses.     Heart sounds: Normal heart sounds.  Pulmonary:     Effort: Pulmonary effort is normal.     Breath sounds: Normal breath sounds.  Abdominal:     General: Abdomen is flat. Bowel sounds are normal. There is no distension.     Palpations: Abdomen is soft. There is no mass.     Tenderness: There is no abdominal tenderness. There is no guarding or rebound.     Hernia: No hernia is present.  Musculoskeletal: Normal range of motion.  Skin:    General: Skin is warm and dry.     Capillary Refill: Capillary refill takes less than 2 seconds.  Neurological:     General: No focal deficit present.     Mental Status: She is alert and oriented to person, place, and time.  Psychiatric:        Mood and Affect: Mood normal.        Behavior: Behavior normal.      ED Treatments / Results  Labs (all labs ordered are listed, but only abnormal results are displayed) Results for orders placed or performed during the hospital encounter of 11/04/18  Urinalysis, Routine w reflex microscopic  Result Value Ref Range   Color, Urine YELLOW YELLOW   APPearance CLOUDY (A) CLEAR   Specific Gravity, Urine 1.025 1.005 - 1.030   pH 6.0 5.0 - 8.0   Glucose, UA NEGATIVE NEGATIVE  mg/dL   Hgb urine dipstick NEGATIVE NEGATIVE   Bilirubin Urine NEGATIVE NEGATIVE   Ketones, ur NEGATIVE NEGATIVE mg/dL   Protein, ur >300 (A) NEGATIVE mg/dL   Nitrite NEGATIVE NEGATIVE   Leukocytes,Ua NEGATIVE NEGATIVE  Urinalysis, Microscopic (reflex)  Result Value Ref Range   RBC / HPF 0-5 0 - 5 RBC/hpf   WBC, UA 0-5 0 - 5 WBC/hpf   Bacteria, UA MANY (A) NONE SEEN   Squamous Epithelial / LPF 11-20 0 - 5   Budding Yeast PRESENT    Dg Chest 2 View  Result Date: 11/04/2018 CLINICAL DATA:  Shortness of breath, congestion, and body aches for 5 days. EXAM: CHEST - 2 VIEW COMPARISON:  10/02/2016 FINDINGS: The heart size and mediastinal contours are within normal limits. Both lungs are clear. The visualized skeletal structures are unremarkable. IMPRESSION: No active cardiopulmonary disease. Electronically Signed   By: Lucienne Capers M.D.   On: 11/04/2018 01:45    EKG None  Radiology Dg Chest 2 View  Result Date: 11/04/2018 CLINICAL DATA:  Shortness of breath, congestion, and body aches for 5 days. EXAM: CHEST - 2 VIEW COMPARISON:  10/02/2016 FINDINGS: The heart size and mediastinal contours are within normal limits. Both lungs are clear. The visualized skeletal structures are unremarkable. IMPRESSION: No active cardiopulmonary disease. Electronically Signed   By: Lucienne Capers M.D.   On: 11/04/2018 01:45    Procedures Procedures (including critical care time)  Medications Ordered in ED Medications  benzonatate (TESSALON) capsule 200 mg (200 mg Oral Given 11/04/18 0220)  acetaminophen (TYLENOL) tablet 1,000 mg (1,000 mg Oral Given 11/04/18 0220)  sulfamethoxazole-trimethoprim (BACTRIM DS,SEPTRA DS) 800-160 MG per tablet 1 tablet (1 tablet Oral Given 11/04/18 0259)     I suspect the majority of her abdominal pain is from coughing.  Her exam and vitals are benign and reassuring.  Final Clinical Impressions(s) / ED Diagnoses   Final diagnoses:  Viral URI with cough  Urinary  tract infection without hematuria, site unspecified   Return for pain, intractable cough, fevers >100.4 unrelieved by medication, shortness of breath, intractable vomiting, chest pain, shortness of breath, weakness numbness, changes in speech, facial asymmetry,abdominal pain, passing out,Inability to tolerate liquids or food, cough, altered mental status or any concerns. No signs of systemic illness or infection. The patient is nontoxic-appearing on exam and vital signs are within normal limits.   I have reviewed the triage vital signs and the nursing notes. Pertinent labs &imaging results that were available during my care of the patient were reviewed by me and considered in my medical decision making (see chart for details).  After history, exam, and medical workup I feel the patient has been appropriately medically screened and is safe for discharge home. Pertinent diagnoses were discussed with the patient. Patient was given return precautions. ED Discharge Orders         Ordered    benzonatate (TESSALON) 100 MG capsule  Every 8 hours     11/04/18 0307    sulfamethoxazole-trimethoprim (BACTRIM DS,SEPTRA DS) 800-160 MG tablet  2 times daily     11/04/18 0307           Matilyn Fehrman, MD 11/04/18 [id number]

## 2018-11-04 NOTE — ED Notes (Signed)
RT assessed pt.

## 2018-11-08 ENCOUNTER — Other Ambulatory Visit: Payer: Self-pay | Admitting: Family Medicine

## 2019-01-02 ENCOUNTER — Other Ambulatory Visit: Payer: Self-pay

## 2019-01-02 MED ORDER — CLOPIDOGREL BISULFATE 75 MG PO TABS: 75.0000 mg | ORAL_TABLET | Freq: Every day | ORAL | 0 refills | Status: DC

## 2019-03-04 ENCOUNTER — Other Ambulatory Visit: Payer: Self-pay | Admitting: *Deleted

## 2019-03-04 MED ORDER — CLOPIDOGREL BISULFATE 75 MG PO TABS: 75.0000 mg | ORAL_TABLET | Freq: Every day | ORAL | 1 refills | Status: DC

## 2019-04-03 ENCOUNTER — Telehealth: Payer: Self-pay | Admitting: Family Medicine

## 2019-04-03 DIAGNOSIS — Z13220 Encounter for screening for lipoid disorders: Secondary | ICD-10-CM

## 2019-04-03 DIAGNOSIS — I10 Essential (primary) hypertension: Secondary | ICD-10-CM

## 2019-04-03 DIAGNOSIS — E118 Type 2 diabetes mellitus with unspecified complications: Secondary | ICD-10-CM

## 2019-04-03 DIAGNOSIS — I6930 Unspecified sequelae of cerebral infarction: Secondary | ICD-10-CM

## 2019-04-03 NOTE — Telephone Encounter (Signed)
Ok for CMP, lipid A1C.

## 2019-04-03 NOTE — Addendum Note (Signed)
Addended by: Towana Badger on: 04/03/2019 04:38 PM   Modules accepted: Orders

## 2019-04-03 NOTE — Telephone Encounter (Signed)
Pt's spouse scheduled her Diabetic and med f/u appt with Dr. Madilyn Fireman on July 31st  And he wants her labs to be done before- hand but needs the orders for the [facility]. On Janace Aris in [place] can reach him on his Cell # [phone]

## 2019-04-03 NOTE — Telephone Encounter (Signed)
Labs ordered and faxed to:  [facility], [place] [address]   Phone: [phone]  Fax: [phone]

## 2019-04-03 NOTE — Telephone Encounter (Signed)
Pt advised.

## 2019-04-10 ENCOUNTER — Other Ambulatory Visit: Payer: Self-pay

## 2019-04-10 ENCOUNTER — Ambulatory Visit (INDEPENDENT_AMBULATORY_CARE_PROVIDER_SITE_OTHER): Payer: Managed Care, Other (non HMO) | Admitting: Family Medicine

## 2019-04-10 ENCOUNTER — Encounter: Payer: Self-pay | Admitting: Family Medicine

## 2019-04-10 VITALS — BP 141/61 | HR 82 | Ht 62.0 in | Wt 180.0 lb

## 2019-04-10 DIAGNOSIS — E118 Type 2 diabetes mellitus with unspecified complications: Secondary | ICD-10-CM | POA: Diagnosis not present

## 2019-04-10 DIAGNOSIS — I69320 Aphasia following cerebral infarction: Secondary | ICD-10-CM

## 2019-04-10 DIAGNOSIS — Z1231 Encounter for screening mammogram for malignant neoplasm of breast: Secondary | ICD-10-CM

## 2019-04-10 DIAGNOSIS — I10 Essential (primary) hypertension: Secondary | ICD-10-CM | POA: Diagnosis not present

## 2019-04-10 DIAGNOSIS — I6930 Unspecified sequelae of cerebral infarction: Secondary | ICD-10-CM | POA: Diagnosis not present

## 2019-04-10 LAB — POCT GLYCOSYLATED HEMOGLOBIN (HGB A1C): Hemoglobin A1C: 7.4 % — AB (ref 4.0–5.6)

## 2019-04-10 LAB — POCT UA - MICROALBUMIN
Creatinine, POC: 200 mg/dL
Microalbumin Ur, POC: 150 mg/L

## 2019-04-10 MED ORDER — METFORMIN HCL ER 500 MG PO TB24: 500.0000 mg | ORAL_TABLET | Freq: Every day | ORAL | 1 refills | Status: DC

## 2019-04-10 MED ORDER — METOPROLOL SUCCINATE ER 100 MG PO TB24: 100.0000 mg | ORAL_TABLET | Freq: Every day | ORAL | 1 refills | Status: DC

## 2019-04-10 MED ORDER — AMLODIPINE BESYLATE 10 MG PO TABS: 10.0000 mg | ORAL_TABLET | Freq: Every day | ORAL | 1 refills | Status: DC

## 2019-04-10 MED ORDER — ATORVASTATIN CALCIUM 80 MG PO TABS: 80.0000 mg | ORAL_TABLET | Freq: Every day | ORAL | 3 refills | Status: DC

## 2019-04-10 NOTE — Assessment & Plan Note (Signed)
BP not as well-controlled as I would like to see it.  But she is already on max dose of metoprolol and amlodipine.  She has made some changes and is trying to lose some weight.  And like to see her back in 3 months if she is not getting it under control then we will need to make some adjustments to her regimen.

## 2019-04-10 NOTE — Assessment & Plan Note (Signed)
Working with speech therapy again which she is finding very helpful.

## 2019-04-10 NOTE — Progress Notes (Signed)
Established Patient Office Visit  Subjective:  Patient ID: Sabrina Dougherty, female    DOB: Feb 17, 1961  Age: 58 y.o. MRN: [id number]  CC:  Chief Complaint  Patient presents with  . Diabetes  . Hypertension  . Hyperlipidemia    HPI Sabrina Dougherty presents for  Hypertension- Pt denies chest pain, SOB, dizziness, or heart palpitations.  Taking meds as directed w/o problems.  Denies medication side effects.    Diabetes - no hypoglycemic events. No wounds or sores that are not healing well. No increased thirst or urination. Checking glucose at home. Taking medications as prescribed without any side effects.  He has her eye exam scheduled for next month.  She is started doing a protein drink for a meal replacement and feels like that has helped lower her blood sugars.  She has had a couple lows since she was here last time.  He is getting speech therapy again for her stroke.  She had started but then had quit going and now she is back again and says it is really been helpful and she likes the therapist that she is working with.   Past Medical History:  Diagnosis Date  . Diabetes mellitus   . Pancreatitis   . Stroke [facility]) 1990s   left middle cerebral artery stroke  . Stroke ([place]) 02/2012   left temporal infarct    No past surgical history on file.  Family History  Problem Relation Age of Onset  . Microcephaly Daughter        Seckel syndrome  . Alcohol abuse Mother   . Diabetes Sister     Social History   Socioeconomic History  . Marital status: Married    Spouse name: Not on file  . Number of children: Not on file  . Years of education: Not on file  . Highest education level: Not on file  Occupational History  . Not on file  Social Needs  . Financial resource strain: Not on file  . Food insecurity    Worry: Not on file    Inability: Not on file  . Transportation needs    Medical: Not on file    Non-medical: Not on file  Tobacco Use  . Smoking  status: Former Smoker    Packs/day: 0.10    Types: Cigarettes    Quit date: 01/13/2012    Years since quitting: 7.2  . Smokeless tobacco: Never Used  Substance and Sexual Activity  . Alcohol use: Yes    Alcohol/week: 3.0 standard drinks    Types: 3 Standard drinks or equivalent per week  . Drug use: No  . Sexual activity: Yes  Lifestyle  . Physical activity    Days per week: Not on file    Minutes per session: Not on file  . Stress: Not on file  Relationships  . Social Herbalist on phone: Not on file    Gets together: Not on file    Attends religious service: Not on file    Active member of club or organization: Not on file    Attends meetings of clubs or organizations: Not on file    Relationship status: Not on file  . Intimate partner violence    Fear of current or ex partner: Not on file    Emotionally abused: Not on file    Physically abused: Not on file    Forced sexual activity: Not on file  Other Topics Concern  . Not on file  Social History Narrative   Lives with her husband and daughter who is disabled. Exercises some.     Outpatient Medications Prior to Visit  Medication Sig Dispense Refill  . Alcohol Swabs (ALCOHOL PREP) PADS For use when checking daily blood sugars. 600 each 4  . AMBULATORY NON FORMULARY MEDICATION Medication Name: One touch Verio Flex kit DX:E11.9 For testing 3 times a day 1 each 0  . aspirin 81 MG tablet Take 81 mg by mouth daily.    . clopidogrel (PLAVIX) 75 MG tablet Take 1 tablet (75 mg total) by mouth daily. 90 tablet 1  . glucose blood (ACCU-CHEK AVIVA PLUS) test strip For testing blood sugars 3 times daily. Dx:E11.9 300 each 4  . hydrochlorothiazide (MICROZIDE) 12.5 MG capsule Take 1 capsule (12.5 mg total) by mouth daily. 30 capsule 0  . ibuprofen (ADVIL,MOTRIN) 600 MG tablet take 1 tablet by mouth every 6 to 8 hours if needed 90 tablet 2  . insulin glargine (LANTUS) 100 UNIT/ML injection INJECT SUBCUTANEOUSLY 60  UNITS AT  BEDTIME 60 mL 2  . Insulin Pen Needle (1ST TIER UNIFINE PENTIPS) 31G X 6 MM MISC For use when injecting insulin. Dx: E11.9 300 each 1  . Insulin Syringes, Disposable, U-100 0.5 ML MISC Use to inject 60 units of insulin at bedtime.  DX : E11.9 100 each prn  . milk thistle 175 MG tablet Take 175 mg by mouth daily.    . ONE TOUCH LANCETS MISC Check blood sugar 3 times daily. One touch delica lancet 16X Dx: W96.0 600 each 4  . amLODipine (NORVASC) 10 MG tablet Take 1 tablet (10 mg total) by mouth daily. 90 tablet 1  . atorvastatin (LIPITOR) 80 MG tablet Take 1 tablet (80 mg total) by mouth daily at 6 PM. 90 tablet 3  . metFORMIN (GLUCOPHAGE-XR) 500 MG 24 hr tablet Take 1 tablet (500 mg total) by mouth daily with breakfast. 90 tablet 1  . metoprolol succinate (TOPROL-XL) 100 MG 24 hr tablet Take 1 tablet (100 mg total) by mouth daily. 90 tablet 1   No facility-administered medications prior to visit.     Allergies  Allergen Reactions  . Ace Inhibitors   . Amoxicillin-Pot Clavulanate     REACTION: skin peeling  . Fluoxetine Other (See Comments)    sedation  . Propoxyphene Hcl     ROS Review of Systems    Objective:    Physical Exam  Constitutional: She is oriented to person, place, and time. She appears well-developed and well-nourished.  HENT:  Head: Normocephalic and atraumatic.  Cardiovascular: Normal rate, regular rhythm and normal heart sounds.  Radial pulse 2+ bilaterally.  Pulmonary/Chest: Effort normal and breath sounds normal.  Neurological: She is alert and oriented to person, place, and time.  Skin: Skin is warm and dry.  Psychiatric: She has a normal mood and affect. Her behavior is normal.    BP (!) 141/61   Pulse 82   Ht 5' 2"  (1.575 m)   Wt 180 lb (81.6 kg)   LMP 07/10/2011   SpO2 100%   BMI 32.92 kg/m  Wt Readings from Last 3 Encounters:  04/10/19 180 lb (81.6 kg)  11/04/18 180 lb (81.6 kg)  09/19/18 182 lb (82.6 kg)     Health Maintenance Due  Topic  Date Due  . Hepatitis C Screening  03-09-1961  . HIV Screening  10/17/1974  . COLONOSCOPY  10/17/2009  . MAMMOGRAM  04/26/2018    There are no preventive care  reminders to display for this patient.  Lab Results  Component Value Date   TSH 1.110 01/16/2018   Lab Results  Component Value Date   WBC 8.9 07/05/2015   HGB 9.2 (A) 07/05/2015   HCT 29 (A) 07/05/2015   PLT 675 (A) 07/05/2015   Lab Results  Component Value Date   NA 143 01/16/2018   K 3.9 01/16/2018   CO2 24 01/16/2018   GLUCOSE 90 01/16/2018   BUN 14 01/16/2018   CREATININE 0.85 01/16/2018   BILITOT 0.6 01/16/2018   ALKPHOS 78 01/16/2018   AST 15 01/16/2018   ALT 13 01/16/2018   PROT 7.1 01/16/2018   ALBUMIN 4.3 01/16/2018   CALCIUM 9.8 01/16/2018   Lab Results  Component Value Date   CHOL 206 (H) 01/16/2018   Lab Results  Component Value Date   HDL 98 01/16/2018   Lab Results  Component Value Date   LDLCALC 81 01/16/2018   Lab Results  Component Value Date   TRIG 136 01/16/2018   Lab Results  Component Value Date   CHOLHDL 2.1 01/16/2018   Lab Results  Component Value Date   HGBA1C 7.4 (A) 04/10/2019      Assessment & Plan:   Problem List Items Addressed This Visit      Cardiovascular and Mediastinum   HYPERTENSION, BENIGN SYSTEMIC - Primary    BP not as well-controlled as I would like to see it.  But she is already on max dose of metoprolol and amlodipine.  She has made some changes and is trying to lose some weight.  And like to see her back in 3 months if she is not getting it under control then we will need to make some adjustments to her regimen.      Relevant Medications   atorvastatin (LIPITOR) 80 MG tablet   metoprolol succinate (TOPROL-XL) 100 MG 24 hr tablet   amLODipine (NORVASC) 10 MG tablet   Aphasia S/P CVA    Working with speech therapy again which she is finding very helpful.      Relevant Medications   atorvastatin (LIPITOR) 80 MG tablet   metoprolol succinate  (TOPROL-XL) 100 MG 24 hr tablet   amLODipine (NORVASC) 10 MG tablet     Endocrine   Type 2 diabetes mellitus with complications (HCC)    Much improved.  A1c down to 7.4.  She has her eye exam scheduled for next month.  Foot exam performed today.  Urine microalbumin also performed today.  Follow-up in 3 months.  She is made some great progress and just encouraged her to start adding in her exercise routine and I think this would help significantly.      Relevant Medications   atorvastatin (LIPITOR) 80 MG tablet   metFORMIN (GLUCOPHAGE-XR) 500 MG 24 hr tablet   Other Relevant Orders   POCT glycosylated hemoglobin (Hb A1C) (Completed)   POCT UA - Microalbumin (Completed)     Other   History of stroke with residual deficit    Other Visit Diagnoses    Screening mammogram, encounter for       Relevant Orders   MM 3D SCREEN BREAST BILATERAL      Meds ordered this encounter  Medications  . atorvastatin (LIPITOR) 80 MG tablet    Sig: Take 1 tablet (80 mg total) by mouth daily at 6 PM.    Dispense:  90 tablet    Refill:  3  . metFORMIN (GLUCOPHAGE-XR) 500 MG 24 hr tablet  Sig: Take 1 tablet (500 mg total) by mouth daily with breakfast.    Dispense:  90 tablet    Refill:  1  . metoprolol succinate (TOPROL-XL) 100 MG 24 hr tablet    Sig: Take 1 tablet (100 mg total) by mouth daily.    Dispense:  90 tablet    Refill:  1  . amLODipine (NORVASC) 10 MG tablet    Sig: Take 1 tablet (10 mg total) by mouth daily.    Dispense:  90 tablet    Refill:  1    Follow-up: Return in about 3 months (around 07/11/2019) for wellness and Pap smear.  Beatrice Lecher, MD

## 2019-04-10 NOTE — Assessment & Plan Note (Signed)
Much improved.  A1c down to 7.4.  She has her eye exam scheduled for next month.  Foot exam performed today.  Urine microalbumin also performed today.  Follow-up in 3 months.  She is made some great progress and just encouraged her to start adding in her exercise routine and I think this would help significantly.

## 2019-04-11 LAB — COMPREHENSIVE METABOLIC PANEL
ALT: 17 IU/L (ref 0–32)
AST: 19 IU/L (ref 0–40)
Albumin/Globulin Ratio: 2 (ref 1.2–2.2)
Albumin: 4.8 g/dL (ref 3.8–4.9)
Alkaline Phosphatase: 83 IU/L (ref 39–117)
BUN/Creatinine Ratio: 13 (ref 9–23)
BUN: 11 mg/dL (ref 6–24)
Bilirubin Total: 0.3 mg/dL (ref 0.0–1.2)
CO2: 19 mmol/L — ABNORMAL LOW (ref 20–29)
Calcium: 10.2 mg/dL (ref 8.7–10.2)
Chloride: 101 mmol/L (ref 96–106)
Creatinine, Ser: 0.82 mg/dL (ref 0.57–1.00)
GFR calc Af Amer: 91 mL/min/{1.73_m2} (ref >59)
GFR calc non Af Amer: 79 mL/min/{1.73_m2} (ref >59)
Globulin, Total: 2.4 g/dL (ref 1.5–4.5)
Glucose: 182 mg/dL — ABNORMAL HIGH (ref 65–99)
Potassium: 3.8 mmol/L (ref 3.5–5.2)
Sodium: 141 mmol/L (ref 134–144)
Total Protein: 7.2 g/dL (ref 6.0–8.5)

## 2019-04-11 LAB — HEMOGLOBIN A1C
Est. average glucose Bld gHb Est-mCnc: 160 mg/dL
Hgb A1c MFr Bld: 7.2 % — ABNORMAL HIGH (ref 4.8–5.6)

## 2019-04-11 LAB — LIPID PANEL
Chol/HDL Ratio: 2.5 ratio (ref 0.0–4.4)
Cholesterol, Total: 190 mg/dL (ref 100–199)
HDL: 75 mg/dL (ref >39)
LDL Calculated: 73 mg/dL (ref 0–99)
Triglycerides: 211 mg/dL — ABNORMAL HIGH (ref 0–149)
VLDL Cholesterol Cal: 42 mg/dL — ABNORMAL HIGH (ref 5–40)

## 2019-04-15 ENCOUNTER — Other Ambulatory Visit: Payer: Self-pay | Admitting: Neurology

## 2019-04-15 MED ORDER — ALCOHOL PREP PADS: MEDICATED_PAD | 1 refills | Status: DC

## 2019-05-19 ENCOUNTER — Other Ambulatory Visit: Payer: Self-pay | Admitting: *Deleted

## 2019-05-19 DIAGNOSIS — E118 Type 2 diabetes mellitus with unspecified complications: Secondary | ICD-10-CM

## 2019-05-19 MED ORDER — INSULIN GLARGINE 100 UNIT/ML ~~LOC~~ SOLN: SUBCUTANEOUS | 2 refills | Status: DC

## 2019-07-06 ENCOUNTER — Other Ambulatory Visit: Payer: Self-pay

## 2019-07-06 MED ORDER — METFORMIN HCL ER 500 MG PO TB24: 500.0000 mg | ORAL_TABLET | Freq: Every day | ORAL | 0 refills | Status: DC

## 2019-07-13 ENCOUNTER — Encounter: Payer: Self-pay | Admitting: Family Medicine

## 2019-07-13 ENCOUNTER — Other Ambulatory Visit (HOSPITAL_COMMUNITY)
Admission: RE | Admit: 2019-07-13 | Discharge: 2019-07-13 | Disposition: A | Discharge status: Home / Self Care | Payer: Managed Care, Other (non HMO) | Source: Ambulatory Visit | Attending: Family Medicine | Admitting: Family Medicine

## 2019-07-13 ENCOUNTER — Ambulatory Visit (INDEPENDENT_AMBULATORY_CARE_PROVIDER_SITE_OTHER): Payer: Managed Care, Other (non HMO) | Admitting: Family Medicine

## 2019-07-13 VITALS — BP 161/63 | HR 94 | Ht 62.0 in | Wt 186.0 lb

## 2019-07-13 DIAGNOSIS — I10 Essential (primary) hypertension: Secondary | ICD-10-CM | POA: Diagnosis not present

## 2019-07-13 DIAGNOSIS — Z0000 Encounter for general adult medical examination without abnormal findings: Secondary | ICD-10-CM

## 2019-07-13 DIAGNOSIS — Z124 Encounter for screening for malignant neoplasm of cervix: Secondary | ICD-10-CM | POA: Insufficient documentation

## 2019-07-13 DIAGNOSIS — F1010 Alcohol abuse, uncomplicated: Secondary | ICD-10-CM

## 2019-07-13 DIAGNOSIS — A0472 Enterocolitis due to Clostridium difficile, not specified as recurrent: Secondary | ICD-10-CM | POA: Insufficient documentation

## 2019-07-13 DIAGNOSIS — Z1211 Encounter for screening for malignant neoplasm of colon: Secondary | ICD-10-CM | POA: Diagnosis not present

## 2019-07-13 DIAGNOSIS — E118 Type 2 diabetes mellitus with unspecified complications: Secondary | ICD-10-CM

## 2019-07-13 DIAGNOSIS — A4150 Gram-negative sepsis, unspecified: Secondary | ICD-10-CM | POA: Insufficient documentation

## 2019-07-13 DIAGNOSIS — Z1159 Encounter for screening for other viral diseases: Secondary | ICD-10-CM

## 2019-07-13 MED ORDER — METFORMIN HCL ER 500 MG PO TB24: 500.0000 mg | ORAL_TABLET | Freq: Every day | ORAL | 1 refills | Status: DC

## 2019-07-13 MED ORDER — CLOPIDOGREL BISULFATE 75 MG PO TABS: 75.0000 mg | ORAL_TABLET | Freq: Every day | ORAL | 1 refills | Status: DC

## 2019-07-13 NOTE — Progress Notes (Signed)
Subjective:     Sabrina Dougherty is a 58 y.o. female history of diabetes, hypertension and stroke and is here for a comprehensive physical exam. The patient reports problems - yes.  Initially she said she was doing well but as we continue to talk she reported that she has been drinking alcohol daily to the point where she passes out and falls asleep ever since the death of her daughter.  She just reports she has not been handling it very well at all and really feels like she might need to be in an inpatient program.  He is just really struggling with her grief right now.  She has not been taking the best care of herself.  She has not been exercising.  Social History   Socioeconomic History  . Marital status: Married    Spouse name: Not on file  . Number of children: Not on file  . Years of education: Not on file  . Highest education level: Not on file  Occupational History  . Not on file  Social Needs  . Financial resource strain: Not on file  . Food insecurity    Worry: Not on file    Inability: Not on file  . Transportation needs    Medical: Not on file    Non-medical: Not on file  Tobacco Use  . Smoking status: Former Smoker    Packs/day: 0.10    Types: Cigarettes    Quit date: 01/13/2012    Years since quitting: 7.5  . Smokeless tobacco: Never Used  Substance and Sexual Activity  . Alcohol use: Yes    Alcohol/week: 3.0 standard drinks    Types: 3 Standard drinks or equivalent per week  . Drug use: No  . Sexual activity: Yes  Lifestyle  . Physical activity    Days per week: Not on file    Minutes per session: Not on file  . Stress: Not on file  Relationships  . Social Herbalist on phone: Not on file    Gets together: Not on file    Attends religious service: Not on file    Active member of club or organization: Not on file    Attends meetings of clubs or organizations: Not on file    Relationship status: Not on file  . Intimate partner violence   Fear of current or ex partner: Not on file    Emotionally abused: Not on file    Physically abused: Not on file    Forced sexual activity: Not on file  Other Topics Concern  . Not on file  Social History Narrative   Lives with her husband and daughter who is disabled. Exercises some.    Health Maintenance  Topic Date Due  . Hepatitis C Screening  [birth year]  . HIV Screening  10/17/1974  . COLONOSCOPY  10/17/2009  . MAMMOGRAM  04/26/2018  . INFLUENZA VACCINE  12/09/2019 (Originally 04/11/2019)  . OPHTHALMOLOGY EXAM  12/09/2019 (Originally 10/09/2017)  . HEMOGLOBIN A1C  10/11/2019  . PAP SMEAR-Modifier  11/03/2019  . FOOT EXAM  04/09/2020  . URINE MICROALBUMIN  04/09/2020  . TETANUS/TDAP  03/20/2022  . PNEUMOCOCCAL POLYSACCHARIDE VACCINE AGE 81-64 HIGH RISK  Completed    The following portions of the patient's history were reviewed and updated as appropriate: allergies, current medications, past family history, past medical history, past social history, past surgical history and problem list.  Review of Systems A comprehensive review of systems was negative.   Objective:  BP (!) 161/63   Pulse 94   Ht 5\' 2"  (1.575 m)   Wt 186 lb (84.4 kg)   LMP 07/10/2011   SpO2 100%   BMI 34.02 kg/m  General appearance: alert, cooperative and appears stated age Head: Normocephalic, without obvious abnormality, atraumatic Eyes: copnj clear, EOMI, PEERLA Ears: normal TM's and external ear canals both ears Nose: Nares normal. Septum midline. Mucosa normal. No drainage or sinus tenderness. Throat: lips, mucosa, and tongue normal; teeth and gums normal Neck: no adenopathy, no carotid bruit, no JVD, supple, symmetrical, trachea midline and thyroid not enlarged, symmetric, no tenderness/mass/nodules Back: symmetric, no curvature. ROM normal. No CVA tenderness. Lungs: clear to auscultation bilaterally Breasts: normal appearance, no masses or tenderness Heart: regular rate and rhythm, S1, S2  normal, no murmur, click, rub or gallop Abdomen: soft, non-tender; bowel sounds normal; no masses,  no organomegaly Pelvic: cervix normal in appearance, external genitalia normal, no adnexal masses or tenderness, no cervical motion tenderness, rectovaginal septum normal, uterus normal size, shape, and consistency and vagina normal without discharge Extremities: extremities normal, atraumatic, no cyanosis or edema Pulses: 2+ and symmetric Skin: Skin color, texture, turgor normal. No rashes or lesions Lymph nodes: Cervical, supraclavicular, and axillary nodes normal. Neurologic: Alert and oriented X 3, normal strength and tone. Normal symmetric reflexes. Normal coordination and gait    Assessment:    Healthy female exam.      Plan:     See After Visit Summary for Counseling Recommendations   Keep up a regular exercise program and make sure you are eating a healthy diet Try to eat 4 servings of dairy a day, or if you are lactose intolerant take a calcium with vitamin D daily.  Your vaccines are up to date.  Declined flu vaccine. Gust need for colon cancer screening.  Agrees to do Cologuard today.  Alcohol abuse-we will refer for inpatient alcohol substance abuse treatment.  Implies she shared this information with center like to get her help as soon as possible.  Hypertension-uncontrolled.  Likely from increased alcohol intake.  Like to see her back in about 4 weeks or after she is discharged from inpatient program.

## 2019-07-13 NOTE — Patient Instructions (Signed)
Health Maintenance for Postmenopausal Women Menopause is a normal process in which your ability to get pregnant comes to an end. This process happens slowly over many months or years, usually between the ages of 48 and 55. Menopause is complete when you have missed your menstrual periods for 12 months. It is important to talk with your health care provider about some of the most common conditions that affect women after menopause (postmenopausal women). These include heart disease, cancer, and bone loss (osteoporosis). Adopting a healthy lifestyle and getting preventive care can help to promote your health and wellness. The actions you take can also lower your chances of developing some of these common conditions. What should I know about menopause? During menopause, you may get a number of symptoms, such as:  Hot flashes. These can be moderate or severe.  Night sweats.  Decrease in sex drive.  Mood swings.  Headaches.  Tiredness.  Irritability.  Memory problems.  Insomnia. Choosing to treat or not to treat these symptoms is a decision that you make with your health care provider. Do I need hormone replacement therapy?  Hormone replacement therapy is effective in treating symptoms that are caused by menopause, such as hot flashes and night sweats.  Hormone replacement carries certain risks, especially as you become older. If you are thinking about using estrogen or estrogen with progestin, discuss the benefits and risks with your health care provider. What is my risk for heart disease and stroke? The risk of heart disease, heart attack, and stroke increases as you age. One of the causes may be a change in the body's hormones during menopause. This can affect how your body uses dietary fats, triglycerides, and cholesterol. Heart attack and stroke are medical emergencies. There are many things that you can do to help prevent heart disease and stroke. Watch your blood pressure  High  blood pressure causes heart disease and increases the risk of stroke. This is more likely to develop in people who have high blood pressure readings, are of African descent, or are overweight.  Have your blood pressure checked: ? Every 3-5 years if you are 18-39 years of age. ? Every year if you are 40 years old or older. Eat a healthy diet   Eat a diet that includes plenty of vegetables, fruits, low-fat dairy products, and lean protein.  Do not eat a lot of foods that are high in solid fats, added sugars, or sodium. Get regular exercise Get regular exercise. This is one of the most important things you can do for your health. Most adults should:  Try to exercise for at least 150 minutes each week. The exercise should increase your heart rate and make you sweat (moderate-intensity exercise).  Try to do strengthening exercises at least twice each week. Do these in addition to the moderate-intensity exercise.  Spend less time sitting. Even light physical activity can be beneficial. Other tips  Work with your health care provider to achieve or maintain a healthy weight.  Do not use any products that contain nicotine or tobacco, such as cigarettes, e-cigarettes, and chewing tobacco. If you need help quitting, ask your health care provider.  Know your numbers. Ask your health care provider to check your cholesterol and your blood sugar (glucose). Continue to have your blood tested as directed by your health care provider. Do I need screening for cancer? Depending on your health history and family history, you may need to have cancer screening at different stages of your life. This   may include screening for:  Breast cancer.  Cervical cancer.  Lung cancer.  Colorectal cancer. What is my risk for osteoporosis? After menopause, you may be at increased risk for osteoporosis. Osteoporosis is a condition in which bone destruction happens more quickly than new bone creation. To help prevent  osteoporosis or the bone fractures that can happen because of osteoporosis, you may take the following actions:  If you are 5-33 years old, get at least 1,000 mg of calcium and at least 600 mg of vitamin D per day.  If you are older than age 58 but younger than age 74, get at least 1,200 mg of calcium and at least 600 mg of vitamin D per day.  If you are older than age 30, get at least 1,200 mg of calcium and at least 800 mg of vitamin D per day. Smoking and drinking excessive alcohol increase the risk of osteoporosis. Eat foods that are rich in calcium and vitamin D, and do weight-bearing exercises several times each week as directed by your health care provider. How does menopause affect my mental health? Depression may occur at any age, but it is more common as you become older. Common symptoms of depression include:  Low or sad mood.  Changes in sleep patterns.  Changes in appetite or eating patterns.  Feeling an overall lack of motivation or enjoyment of activities that you previously enjoyed.  Frequent crying spells. Talk with your health care provider if you think that you are experiencing depression. General instructions See your health care provider for regular wellness exams and vaccines. This may include:  Scheduling regular health, dental, and eye exams.  Getting and maintaining your vaccines. These include: ? Influenza vaccine. Get this vaccine each year before the flu season begins. ? Pneumonia vaccine. ? Shingles vaccine. ? Tetanus, diphtheria, and pertussis (Tdap) booster vaccine. Your health care provider may also recommend other immunizations. Tell your health care provider if you have ever been abused or do not feel safe at home. Summary  Menopause is a normal process in which your ability to get pregnant comes to an end.  This condition causes hot flashes, night sweats, decreased interest in sex, mood swings, headaches, or lack of sleep.  Treatment for this  condition may include hormone replacement therapy.  Take actions to keep yourself healthy, including exercising regularly, eating a healthy diet, watching your weight, and checking your blood pressure and blood sugar levels.  Get screened for cancer and depression. Make sure that you are up to date with all your vaccines. This information is not intended to replace advice given to you by your health care provider. Make sure you discuss any questions you have with your health care provider. Document Released: 10/19/2005 Document Revised: 08/20/2018 Document Reviewed: 08/20/2018 Elsevier Patient Education  2020 [place] Maintenance, Female Adopting a healthy lifestyle and getting preventive care are important in promoting health and wellness. Ask your health care provider about:  The right schedule for you to have regular tests and exams.  Things you can do on your own to prevent diseases and keep yourself healthy. What should I know about diet, weight, and exercise? Eat a healthy diet   Eat a diet that includes plenty of vegetables, fruits, low-fat dairy products, and lean protein.  Do not eat a lot of foods that are high in solid fats, added sugars, or sodium. Maintain a healthy weight Body mass index (BMI) is used to identify weight problems. It estimates body  fat based on height and weight. Your health care provider can help determine your BMI and help you achieve or maintain a healthy weight. Get regular exercise Get regular exercise. This is one of the most important things you can do for your health. Most adults should:  Exercise for at least 150 minutes each week. The exercise should increase your heart rate and make you sweat (moderate-intensity exercise).  Do strengthening exercises at least twice a week. This is in addition to the moderate-intensity exercise.  Spend less time sitting. Even light physical activity can be beneficial. Watch cholesterol and blood  lipids Have your blood tested for lipids and cholesterol at 59 years of age, then have this test every 5 years. Have your cholesterol levels checked more often if:  Your lipid or cholesterol levels are high.  You are older than 59 years of age.  You are at high risk for heart disease. What should I know about cancer screening? Depending on your health history and family history, you may need to have cancer screening at various ages. This may include screening for:  Breast cancer.  Cervical cancer.  Colorectal cancer.  Skin cancer.  Lung cancer. What should I know about heart disease, diabetes, and high blood pressure? Blood pressure and heart disease  High blood pressure causes heart disease and increases the risk of stroke. This is more likely to develop in people who have high blood pressure readings, are of African descent, or are overweight.  Have your blood pressure checked: ? Every 3-5 years if you are 72-43 years of age. ? Every year if you are 2 years old or older. Diabetes Have regular diabetes screenings. This checks your fasting blood sugar level. Have the screening done:  Once every three years after age 19 if you are at a normal weight and have a low risk for diabetes.  More often and at a younger age if you are overweight or have a high risk for diabetes. What should I know about preventing infection? Hepatitis B If you have a higher risk for hepatitis B, you should be screened for this virus. Talk with your health care provider to find out if you are at risk for hepatitis B infection. Hepatitis C Testing is recommended for:  Everyone born from [birth year] through 1966.  Anyone with known risk factors for hepatitis C. Sexually transmitted infections (STIs)  Get screened for STIs, including gonorrhea and chlamydia, if: ? You are sexually active and are younger than 59 years of age. ? You are older than 59 years of age and your health care provider tells you that  you are at risk for this type of infection. ? Your sexual activity has changed since you were last screened, and you are at increased risk for chlamydia or gonorrhea. Ask your health care provider if you are at risk.  Ask your health care provider about whether you are at high risk for HIV. Your health care provider may recommend a prescription medicine to help prevent HIV infection. If you choose to take medicine to prevent HIV, you should first get tested for HIV. You should then be tested every 3 months for as long as you are taking the medicine. Pregnancy  If you are about to stop having your period (premenopausal) and you may become pregnant, seek counseling before you get pregnant.  Take 400 to 800 micrograms (mcg) of folic acid every day if you become pregnant.  Ask for birth control (contraception) if you want to  prevent pregnancy. Osteoporosis and menopause Osteoporosis is a disease in which the bones lose minerals and strength with aging. This can result in bone fractures. If you are 2 years old or older, or if you are at risk for osteoporosis and fractures, ask your health care provider if you should:  Be screened for bone loss.  Take a calcium or vitamin D supplement to lower your risk of fractures.  Be given hormone replacement therapy (HRT) to treat symptoms of menopause. Follow these instructions at home: Lifestyle  Do not use any products that contain nicotine or tobacco, such as cigarettes, e-cigarettes, and chewing tobacco. If you need help quitting, ask your health care provider.  Do not use street drugs.  Do not share needles.  Ask your health care provider for help if you need support or information about quitting drugs. Alcohol use  Do not drink alcohol if: ? Your health care provider tells you not to drink. ? You are pregnant, may be pregnant, or are planning to become pregnant.  If you drink alcohol: ? Limit how much you use to 0-1 drink a day. ? Limit  intake if you are breastfeeding.  Be aware of how much alcohol is in your drink. In the [place] one drink equals one 12 oz bottle of beer (355 mL), one 5 oz glass of wine (148 mL), or one 1 oz glass of hard liquor (44 mL). General instructions  Schedule regular health, dental, and eye exams.  Stay current with your vaccines.  Tell your health care provider if: ? You often feel depressed. ? You have ever been abused or do not feel safe at home. Summary  Adopting a healthy lifestyle and getting preventive care are important in promoting health and wellness.  Follow your health care provider's instructions about healthy diet, exercising, and getting tested or screened for diseases.  Follow your health care provider's instructions on monitoring your cholesterol and blood pressure. This information is not intended to replace advice given to you by your health care provider. Make sure you discuss any questions you have with your health care provider. Document Released: 03/12/2011 Document Revised: 08/20/2018 Document Reviewed: 08/20/2018 Elsevier Patient Education  2020 [employer].

## 2019-07-15 LAB — CYTOLOGY - PAP
Comment: NEGATIVE
Diagnosis: NEGATIVE
High risk HPV: NEGATIVE

## 2019-07-15 MED ORDER — FLUCONAZOLE 150 MG PO TABS: 150.0000 mg | ORAL_TABLET | Freq: Once | ORAL | 0 refills | Status: DC

## 2019-07-15 NOTE — Addendum Note (Signed)
Addended by: Beatrice Lecher D on: 07/15/2019 04:01 PM   Modules accepted: Orders

## 2019-07-17 ENCOUNTER — Ambulatory Visit: Payer: Managed Care, Other (non HMO) | Admitting: Family Medicine

## 2019-07-22 ENCOUNTER — Ambulatory Visit: Payer: Managed Care, Other (non HMO)

## 2019-08-10 ENCOUNTER — Ambulatory Visit: Payer: Managed Care, Other (non HMO) | Admitting: Family Medicine

## 2019-08-13 ENCOUNTER — Other Ambulatory Visit: Payer: Self-pay

## 2019-08-13 ENCOUNTER — Ambulatory Visit: Payer: Managed Care, Other (non HMO) | Admitting: Family Medicine

## 2019-08-13 ENCOUNTER — Ambulatory Visit (INDEPENDENT_AMBULATORY_CARE_PROVIDER_SITE_OTHER): Payer: Managed Care, Other (non HMO)

## 2019-08-13 DIAGNOSIS — Z1231 Encounter for screening mammogram for malignant neoplasm of breast: Secondary | ICD-10-CM

## 2019-08-15 LAB — BMP8+EGFR
BUN/Creatinine Ratio: 20 (ref 9–23)
BUN: 16 mg/dL (ref 6–24)
CO2: 22 mmol/L (ref 20–29)
Calcium: 10.3 mg/dL — ABNORMAL HIGH (ref 8.7–10.2)
Chloride: 104 mmol/L (ref 96–106)
Creatinine, Ser: 0.8 mg/dL (ref 0.57–1.00)
GFR calc Af Amer: 93 mL/min/{1.73_m2} (ref >59)
GFR calc non Af Amer: 81 mL/min/{1.73_m2} (ref >59)
Glucose: 57 mg/dL — ABNORMAL LOW (ref 65–99)
Potassium: 3.6 mmol/L (ref 3.5–5.2)
Sodium: 143 mmol/L (ref 134–144)

## 2019-08-15 LAB — HEMOGLOBIN A1C
Est. average glucose Bld gHb Est-mCnc: 177 mg/dL
Hgb A1c MFr Bld: 7.8 % — ABNORMAL HIGH (ref 4.8–5.6)

## 2019-08-15 LAB — HEPATITIS C ANTIBODY: Hep C Virus Ab: <0.1 s/co ratio (ref 0.0–0.9)

## 2019-08-17 LAB — HM DIABETES EYE EXAM

## 2019-08-18 ENCOUNTER — Ambulatory Visit (INDEPENDENT_AMBULATORY_CARE_PROVIDER_SITE_OTHER): Payer: Managed Care, Other (non HMO) | Admitting: Family Medicine

## 2019-08-18 ENCOUNTER — Other Ambulatory Visit: Payer: Self-pay

## 2019-08-18 ENCOUNTER — Telehealth: Payer: Self-pay | Admitting: Family Medicine

## 2019-08-18 ENCOUNTER — Encounter: Payer: Self-pay | Admitting: Family Medicine

## 2019-08-18 VITALS — BP 157/70 | HR 78 | Ht 62.0 in | Wt 183.0 lb

## 2019-08-18 DIAGNOSIS — I10 Essential (primary) hypertension: Secondary | ICD-10-CM

## 2019-08-18 DIAGNOSIS — R0689 Other abnormalities of breathing: Secondary | ICD-10-CM

## 2019-08-18 DIAGNOSIS — F4321 Adjustment disorder with depressed mood: Secondary | ICD-10-CM | POA: Diagnosis not present

## 2019-08-18 DIAGNOSIS — F1010 Alcohol abuse, uncomplicated: Secondary | ICD-10-CM

## 2019-08-18 DIAGNOSIS — E118 Type 2 diabetes mellitus with unspecified complications: Secondary | ICD-10-CM

## 2019-08-18 MED ORDER — HYDROCHLOROTHIAZIDE 12.5 MG PO CAPS: 12.5000 mg | ORAL_CAPSULE | Freq: Every day | ORAL | 1 refills | Status: DC

## 2019-08-18 NOTE — Progress Notes (Addendum)
Established Patient Office Visit  Subjective:  Patient ID: Sabrina Dougherty, female    DOB: 10/24/1960  Age: 58 y.o. MRN: [id number]  CC:  Chief Complaint  Patient presents with  . Hypertension    HPI Sabrina Dougherty presents for   Follow-up recent uncontrolled hypertension.  Says she has been checking it at home and its been a little bit better but still elevated.  She says she is taking her medications regularly but when I looked over her medications we have not filled her hydrochlorothiazide since January and it was only for 30 tabs with no refills.  She was also here recently for physical and admitted that she was having a problem with drinking alcohol excessively ever since her daughter died.  We had referred her for an inpatient program.  So she was called for an intake and then told them that she had a history of stroke and they said they would call her back and says she never heard from anyone.  Follow-up diabetes-she went for her A1c a few days ago.  She admits that she is not doing the best with her diet and could do better.  Her son is also here with her today I have never met him before.  Usually her husband comes with her.  He was concerned about his mom having some heavy breathing.  He says he notices it when she sleeping and then also when she is awake.  He has not noticed any significant apneas or shortness of breath.  Lab Results  Component Value Date   HGBA1C 7.8 (H) 08/14/2019     Past Medical History:  Diagnosis Date  . Diabetes mellitus   . Pancreatitis   . Stroke [facility]) 1990s   left middle cerebral artery stroke  . Stroke [facility]) 02/2012   left temporal infarct    History reviewed. No pertinent surgical history.  Family History  Problem Relation Age of Onset  . Microcephaly Daughter        Seckel syndrome  . Alcohol abuse Mother   . Diabetes Sister     Social History   Socioeconomic History  . Marital status: Married    Spouse  name: Not on file  . Number of children: Not on file  . Years of education: Not on file  . Highest education level: Not on file  Occupational History  . Not on file  Social Needs  . Financial resource strain: Not on file  . Food insecurity    Worry: Not on file    Inability: Not on file  . Transportation needs    Medical: Not on file    Non-medical: Not on file  Tobacco Use  . Smoking status: Former Smoker    Packs/day: 0.10    Types: Cigarettes    Quit date: 01/13/2012    Years since quitting: 7.6  . Smokeless tobacco: Never Used  Substance and Sexual Activity  . Alcohol use: Yes    Alcohol/week: 3.0 standard drinks    Types: 3 Standard drinks or equivalent per week  . Drug use: No  . Sexual activity: Yes  Lifestyle  . Physical activity    Days per week: Not on file    Minutes per session: Not on file  . Stress: Not on file  Relationships  . Social Herbalist on phone: Not on file    Gets together: Not on file    Attends religious service: Not on file  Active member of club or organization: Not on file    Attends meetings of clubs or organizations: Not on file    Relationship status: Not on file  . Intimate partner violence    Fear of current or ex partner: Not on file    Emotionally abused: Not on file    Physically abused: Not on file    Forced sexual activity: Not on file  Other Topics Concern  . Not on file  Social History Narrative   Lives with her husband and daughter who is disabled. Exercises some.     Outpatient Medications Prior to Visit  Medication Sig Dispense Refill  . Alcohol Swabs (ALCOHOL PREP) PADS Use to clean area prior to injection 600 each 1  . AMBULATORY NON FORMULARY MEDICATION Medication Name: One touch Verio Flex kit DX:E11.9 For testing 3 times a day 1 each 0  . amLODipine (NORVASC) 10 MG tablet Take 1 tablet (10 mg total) by mouth daily. 90 tablet 1  . aspirin 81 MG tablet Take 81 mg by mouth daily.    Marland Kitchen atorvastatin  (LIPITOR) 80 MG tablet Take 1 tablet (80 mg total) by mouth daily at 6 PM. 90 tablet 3  . clopidogrel (PLAVIX) 75 MG tablet Take 1 tablet (75 mg total) by mouth daily. 90 tablet 1  . glucose blood (ACCU-CHEK AVIVA PLUS) test strip For testing blood sugars 3 times daily. Dx:E11.9 300 each 4  . ibuprofen (ADVIL,MOTRIN) 600 MG tablet take 1 tablet by mouth every 6 to 8 hours if needed 90 tablet 2  . insulin glargine (LANTUS) 100 UNIT/ML injection INJECT SUBCUTANEOUSLY 60  UNITS AT BEDTIME 60 mL 2  . Insulin Pen Needle (1ST TIER UNIFINE PENTIPS) 31G X 6 MM MISC For use when injecting insulin. Dx: E11.9 300 each 1  . Insulin Syringes, Disposable, U-100 0.5 ML MISC Use to inject 60 units of insulin at bedtime.  DX : E11.9 100 each prn  . metFORMIN (GLUCOPHAGE-XR) 500 MG 24 hr tablet Take 1 tablet (500 mg total) by mouth daily with breakfast. 90 tablet 1  . metoprolol succinate (TOPROL-XL) 100 MG 24 hr tablet Take 1 tablet (100 mg total) by mouth daily. 90 tablet 1  . milk thistle 175 MG tablet Take 175 mg by mouth daily.    . ONE TOUCH LANCETS MISC Check blood sugar 3 times daily. One touch delica lancet 79K Dx: W40.9 600 each 4  . hydrochlorothiazide (MICROZIDE) 12.5 MG capsule Take 1 capsule (12.5 mg total) by mouth daily. 30 capsule 0   No facility-administered medications prior to visit.     Allergies  Allergen Reactions  . Ace Inhibitors   . Amoxicillin-Pot Clavulanate     REACTION: skin peeling  . Fluoxetine Other (See Comments)    sedation  . Propoxyphene Hcl     ROS Review of Systems    Objective:    Physical Exam  Constitutional: She is oriented to person, place, and time. She appears well-developed and well-nourished.  HENT:  Head: Normocephalic and atraumatic.  Cardiovascular: Normal rate, regular rhythm and normal heart sounds.  Pulmonary/Chest: Effort normal and breath sounds normal.  Neurological: She is alert and oriented to person, place, and time.  Skin: Skin is warm  and dry.  Psychiatric: She has a normal mood and affect. Her behavior is normal.    BP (!) 157/70   Pulse 78   Ht 5' 2"  (1.575 m)   Wt 183 lb (83 kg)   LMP 07/10/2011  SpO2 99%   BMI 33.47 kg/m  Wt Readings from Last 3 Encounters:  08/18/19 183 lb (83 kg)  07/13/19 186 lb (84.4 kg)  04/10/19 180 lb (81.6 kg)     Health Maintenance Due  Topic Date Due  . HIV Screening  10/17/1974  . COLONOSCOPY  10/17/2009    There are no preventive care reminders to display for this patient.  Lab Results  Component Value Date   TSH 1.110 01/16/2018   Lab Results  Component Value Date   WBC 8.9 07/05/2015   HGB 9.2 (A) 07/05/2015   HCT 29 (A) 07/05/2015   PLT 675 (A) 07/05/2015   Lab Results  Component Value Date   NA 143 08/14/2019   K 3.6 08/14/2019   CO2 22 08/14/2019   GLUCOSE 57 (L) 08/14/2019   BUN 16 08/14/2019   CREATININE 0.80 08/14/2019   BILITOT 0.3 04/10/2019   ALKPHOS 83 04/10/2019   AST 19 04/10/2019   ALT 17 04/10/2019   PROT 7.2 04/10/2019   ALBUMIN 4.8 04/10/2019   CALCIUM 10.3 (H) 08/14/2019   Lab Results  Component Value Date   CHOL 190 04/10/2019   Lab Results  Component Value Date   HDL 75 04/10/2019   Lab Results  Component Value Date   LDLCALC 73 04/10/2019   Lab Results  Component Value Date   TRIG 211 (H) 04/10/2019   Lab Results  Component Value Date   CHOLHDL 2.5 04/10/2019   Lab Results  Component Value Date   HGBA1C 7.8 (H) 08/14/2019      Assessment & Plan:   Problem List Items Addressed This Visit      Cardiovascular and Mediastinum   HYPERTENSION, BENIGN SYSTEMIC - Primary   Relevant Medications   hydrochlorothiazide (MICROZIDE) 12.5 MG capsule     Endocrine   Type 2 diabetes mellitus with complications ([place])    See when it is elevated on labs earlier this week.  She feels like she can really make some major dietary changes and will focus on this to try to get her A1c back down.  Her glucose was actually low on  the lab work so hesitate to increase her Lantus.  Continue with oral Metformin and statin.       Other Visit Diagnoses    Alcohol abuse       Grief       Relevant Orders   Ambulatory referral to [facility]         Grief-patient is open to referral for therapy/counseling I think this could be really helpful for her.  It is been almost a year since her daughter passed away and she is still just grieving severely.  Heavy breathing-stop bang questionnaire completed.  Meds ordered this encounter  Medications  . hydrochlorothiazide (MICROZIDE) 12.5 MG capsule    Sig: Take 1 capsule (12.5 mg total) by mouth daily.    Dispense:  90 capsule    Refill:  1    Follow-up: Return in about 3 months (around 11/16/2019) for Diabetes follow-up, Hypertension.    Beatrice Lecher, MD

## 2019-08-18 NOTE — Patient Instructions (Addendum)
We refilled her hydrochlorothiazide.  [facility] to [facility] it looks like we had not filled it in a while.  Make sure taking the amlodipine, metoprolol and hydrochlorothiazide to control your blood pressure.

## 2019-08-18 NOTE — Telephone Encounter (Signed)
Pt reports received intake call from rehab.  She says they were supposed to call her back but never did.  Jenny Reichmann I know you are checking on this.

## 2019-08-18 NOTE — Assessment & Plan Note (Signed)
See when it is elevated on labs earlier this week.  She feels like she can really make some major dietary changes and will focus on this to try to get her A1c back down.  Her glucose was actually low on the lab work so hesitate to increase her Lantus.  Continue with oral Metformin and statin.

## 2019-08-19 NOTE — Telephone Encounter (Signed)
OK please call pt and encourage her ot call and get enrolled in rehab progrma.

## 2019-08-19 NOTE — Telephone Encounter (Signed)
Dr. Madilyn Fireman    Intake called me back and stated that when they called Sabrina Dougherty in November she declined services with them. - CF

## 2019-08-21 NOTE — Telephone Encounter (Signed)
Unable to leave message, no voicemail.

## 2019-08-24 ENCOUNTER — Other Ambulatory Visit: Payer: Self-pay | Admitting: Family Medicine

## 2019-08-24 DIAGNOSIS — I10 Essential (primary) hypertension: Secondary | ICD-10-CM

## 2019-09-17 ENCOUNTER — Telehealth: Payer: Self-pay | Admitting: Family Medicine

## 2019-09-17 DIAGNOSIS — I69320 Aphasia following cerebral infarction: Secondary | ICD-10-CM

## 2019-09-17 NOTE — Telephone Encounter (Signed)
Yes, please send new referral.

## 2019-09-17 NOTE — Telephone Encounter (Signed)
Dr. Jon Billings from [facility] called stating they need a new referral to continue Synethia's speech therapy the current one has expired. - [facility] [phone]

## 2019-09-17 NOTE — Telephone Encounter (Signed)
Referral placed...Sabrina Dougherty, CMA  

## 2019-09-30 ENCOUNTER — Encounter: Payer: Self-pay | Admitting: *Deleted

## 2019-09-30 NOTE — Telephone Encounter (Signed)
Letter sent to pt.Sabrina Dougherty, Sabrina Dougherty, CMA

## 2019-11-04 ENCOUNTER — Other Ambulatory Visit: Payer: Self-pay

## 2019-11-04 ENCOUNTER — Encounter (HOSPITAL_BASED_OUTPATIENT_CLINIC_OR_DEPARTMENT_OTHER): Payer: Self-pay

## 2019-11-04 ENCOUNTER — Emergency Department (HOSPITAL_BASED_OUTPATIENT_CLINIC_OR_DEPARTMENT_OTHER): Payer: Managed Care, Other (non HMO)

## 2019-11-04 ENCOUNTER — Emergency Department (HOSPITAL_BASED_OUTPATIENT_CLINIC_OR_DEPARTMENT_OTHER)
Admission: EM | Admit: 2019-11-04 | Discharge: 2019-11-04 | Disposition: A | Discharge status: Home / Self Care | Payer: Managed Care, Other (non HMO) | Attending: Emergency Medicine | Admitting: Emergency Medicine

## 2019-11-04 DIAGNOSIS — Z20822 Contact with and (suspected) exposure to covid-19: Secondary | ICD-10-CM | POA: Diagnosis not present

## 2019-11-04 DIAGNOSIS — Z79899 Other long term (current) drug therapy: Secondary | ICD-10-CM | POA: Insufficient documentation

## 2019-11-04 DIAGNOSIS — Z888 Allergy status to other drugs, medicaments and biological substances status: Secondary | ICD-10-CM | POA: Diagnosis not present

## 2019-11-04 DIAGNOSIS — R509 Fever, unspecified: Secondary | ICD-10-CM | POA: Diagnosis not present

## 2019-11-04 DIAGNOSIS — E876 Hypokalemia: Secondary | ICD-10-CM | POA: Diagnosis not present

## 2019-11-04 DIAGNOSIS — E119 Type 2 diabetes mellitus without complications: Secondary | ICD-10-CM | POA: Diagnosis not present

## 2019-11-04 DIAGNOSIS — I69320 Aphasia following cerebral infarction: Secondary | ICD-10-CM | POA: Insufficient documentation

## 2019-11-04 DIAGNOSIS — Z880 Allergy status to penicillin: Secondary | ICD-10-CM | POA: Insufficient documentation

## 2019-11-04 DIAGNOSIS — F17210 Nicotine dependence, cigarettes, uncomplicated: Secondary | ICD-10-CM | POA: Diagnosis not present

## 2019-11-04 DIAGNOSIS — Z794 Long term (current) use of insulin: Secondary | ICD-10-CM | POA: Insufficient documentation

## 2019-11-04 LAB — CBC WITH DIFFERENTIAL/PLATELET
Abs Immature Granulocytes: 0.17 10*3/uL — ABNORMAL HIGH (ref 0.00–0.07)
Basophils Absolute: 0.1 10*3/uL (ref 0.0–0.1)
Basophils Relative: 0 %
Eosinophils Absolute: 0.1 10*3/uL (ref 0.0–0.5)
Eosinophils Relative: 1 %
HCT: 36 % (ref 36.0–46.0)
Hemoglobin: 12.2 g/dL (ref 12.0–15.0)
Immature Granulocytes: 1 %
Lymphocytes Relative: 4 %
Lymphs Abs: 0.8 10*3/uL (ref 0.7–4.0)
MCH: 29.7 pg (ref 26.0–34.0)
MCHC: 33.9 g/dL (ref 30.0–36.0)
MCV: 87.6 fL (ref 80.0–100.0)
Monocytes Absolute: 1 10*3/uL (ref 0.1–1.0)
Monocytes Relative: 6 %
Neutro Abs: 15.2 10*3/uL — ABNORMAL HIGH (ref 1.7–7.7)
Neutrophils Relative %: 88 %
Platelets: 279 10*3/uL (ref 150–400)
RBC: 4.11 MIL/uL (ref 3.87–5.11)
RDW: 12.8 % (ref 11.5–15.5)
WBC: 17.3 10*3/uL — ABNORMAL HIGH (ref 4.0–10.5)
nRBC: 0 % (ref 0.0–0.2)

## 2019-11-04 LAB — COMPREHENSIVE METABOLIC PANEL
ALT: 20 U/L (ref 0–44)
AST: 22 U/L (ref 15–41)
Albumin: 3.5 g/dL (ref 3.5–5.0)
Alkaline Phosphatase: 76 U/L (ref 38–126)
Anion gap: 14 (ref 5–15)
BUN: 19 mg/dL (ref 6–20)
CO2: 23 mmol/L (ref 22–32)
Calcium: 8.9 mg/dL (ref 8.9–10.3)
Chloride: 90 mmol/L — ABNORMAL LOW (ref 98–111)
Creatinine, Ser: 1.11 mg/dL — ABNORMAL HIGH (ref 0.44–1.00)
GFR calc Af Amer: >60 mL/min (ref >60)
GFR calc non Af Amer: 54 mL/min — ABNORMAL LOW (ref >60)
Glucose, Bld: 341 mg/dL — ABNORMAL HIGH (ref 70–99)
Potassium: 2.8 mmol/L — ABNORMAL LOW (ref 3.5–5.1)
Sodium: 127 mmol/L — ABNORMAL LOW (ref 135–145)
Total Bilirubin: 0.6 mg/dL (ref 0.3–1.2)
Total Protein: 7.3 g/dL (ref 6.5–8.1)

## 2019-11-04 LAB — URINALYSIS, MICROSCOPIC (REFLEX)

## 2019-11-04 LAB — LACTIC ACID, PLASMA: Lactic Acid, Venous: 2.5 mmol/L (ref 0.5–1.9)

## 2019-11-04 LAB — URINALYSIS, ROUTINE W REFLEX MICROSCOPIC
Bilirubin Urine: NEGATIVE
Glucose, UA: >=500 mg/dL — AB
Ketones, ur: NEGATIVE mg/dL
Nitrite: NEGATIVE
Protein, ur: >300 mg/dL — AB
Specific Gravity, Urine: 1.015 (ref 1.005–1.030)
pH: 7 (ref 5.0–8.0)

## 2019-11-04 LAB — SARS CORONAVIRUS 2 AG (30 MIN TAT): SARS Coronavirus 2 Ag: NEGATIVE

## 2019-11-04 LAB — GROUP A STREP BY PCR: Group A Strep by PCR: NOT DETECTED

## 2019-11-04 MED ORDER — METOCLOPRAMIDE HCL 5 MG/ML IJ SOLN
10.0000 mg | Freq: Once | INTRAMUSCULAR | Status: AC
  Administered 2019-11-04: 10 mg via INTRAVENOUS
  Filled 2019-11-04: qty 2

## 2019-11-04 MED ORDER — POTASSIUM CHLORIDE CRYS ER 20 MEQ PO TBCR
40.0000 meq | EXTENDED_RELEASE_TABLET | Freq: Once | ORAL | Status: AC
  Administered 2019-11-04: 40 meq via ORAL
  Filled 2019-11-04: qty 2

## 2019-11-04 MED ORDER — POTASSIUM CHLORIDE 10 MEQ/100ML IV SOLN
10.0000 meq | INTRAVENOUS | Status: AC
  Administered 2019-11-04: 18:00:00 10 meq via INTRAVENOUS
  Filled 2019-11-04 (×2): qty 100

## 2019-11-04 MED ORDER — SODIUM CHLORIDE 0.9 % IV BOLUS
1000.0000 mL | Freq: Once | INTRAVENOUS | Status: AC
  Administered 2019-11-04: 1000 mL via INTRAVENOUS

## 2019-11-04 MED ORDER — ACETAMINOPHEN 325 MG PO TABS
650.0000 mg | ORAL_TABLET | Freq: Once | ORAL | Status: AC | PRN
  Administered 2019-11-04: 650 mg via ORAL
  Filled 2019-11-04: qty 2

## 2019-11-04 NOTE — ED Notes (Signed)
Per nurse tech patient would not allow blood draw from available veins. Patient refused blood draws on 2 visible veins on hands. Press photographer and providers informed. No new orders at this time.

## 2019-11-04 NOTE — ED Triage Notes (Signed)
Pt c/o fever, nausea-sneezing x 3 hours-denies cough, sore throat-NAD-steady gait

## 2019-11-04 NOTE — ED Provider Notes (Signed)
[facility] EMERGENCY DEPARTMENT Provider Note   CSN: [id number] Arrival date & time: 11/04/19  1638     History Chief Complaint  Patient presents with  . Fever    Sabrina Dougherty is a 59 y.o. female.  HPI      Level 5 caveat due to difficulty communicating from previous stroke.  Sabrina Dougherty is a 59 y.o. female, with a history of DM, stroke, aphasia, presenting to the ED with fever beginning earlier today.  She also notes sneezing and body aches.  She denies cough, shortness of breath, chest pain, abdominal pain, N/V/D, urinary symptoms, neck stiffness, rash, sore throat, or any other complaints.     Past Medical History:  Diagnosis Date  . Diabetes mellitus   . Pancreatitis   . Stroke [facility]) 1990s   left middle cerebral artery stroke  . Stroke [facility]) 02/2012   left temporal infarct    Patient Active Problem List   Diagnosis Date Noted  . Gram negative sepsis ([place]) 07/13/2019  . Aphasia S/P CVA 12/26/2015  . Depression, acute 12/26/2015  . Expressive aphasia 06/23/2015  . History of stroke with residual deficit 06/22/2015  . Type 2 diabetes mellitus with complications ([place]) 46/65/9935  . Dysarthria 06/22/2015  . Leukocytosis 06/22/2015  . DDD (degenerative disc disease), cervical 11/25/2013  . Smoker 02/28/2012  . ANXIETY STATE, UNSPECIFIED 07/27/2009  . PERIMENOPAUSAL STATUS 04/12/2008  . HYPERTENSION, BENIGN SYSTEMIC 06/18/2006    History reviewed. No pertinent surgical history.   OB History   No obstetric history on file.     Family History  Problem Relation Age of Onset  . Microcephaly Daughter        Seckel syndrome  . Alcohol abuse Mother   . Diabetes Sister     Social History   Tobacco Use  . Smoking status: Current Every Day Smoker    Packs/day: 0.10    Types: Cigarettes  . Smokeless tobacco: Never Used  Substance Use Topics  . Alcohol use: Yes    Comment: occ  . Drug use: No    Home  Medications Prior to Admission medications   Medication Sig Start Date End Date Taking? Authorizing Provider  Alcohol Swabs (ALCOHOL PREP) PADS Use to clean area prior to injection 04/15/19   Breeback, Jade L, PA-C  AMBULATORY NON FORMULARY MEDICATION Medication Name: One touch Verio Flex kit DX:E11.9 For testing 3 times a day 02/04/18   Hali Marry, MD  amLODipine (NORVASC) 10 MG tablet TAKE 1 TABLET BY MOUTH  DAILY 08/24/19   Hali Marry, MD  aspirin 81 MG tablet Take 81 mg by mouth daily.    [provider]  atorvastatin (LIPITOR) 80 MG tablet Take 1 tablet (80 mg total) by mouth daily at 6 PM. 04/10/19   Hali Marry, MD  clopidogrel (PLAVIX) 75 MG tablet Take 1 tablet (75 mg total) by mouth daily. 07/13/19   Hali Marry, MD  glucose blood (ACCU-CHEK AVIVA PLUS) test strip For testing blood sugars 3 times daily. Dx:E11.9 02/07/18   Hali Marry, MD  hydrochlorothiazide (MICROZIDE) 12.5 MG capsule Take 1 capsule (12.5 mg total) by mouth daily. 08/18/19   Hali Marry, MD  ibuprofen (ADVIL,MOTRIN) 600 MG tablet take 1 tablet by mouth every 6 to 8 hours if needed 12/04/16   Hali Marry, MD  insulin glargine (LANTUS) 100 UNIT/ML injection INJECT SUBCUTANEOUSLY 60  UNITS AT BEDTIME 05/19/19   Hali Marry, MD  Insulin  Pen Needle (1ST TIER UNIFINE PENTIPS) 31G X 6 MM MISC For use when injecting insulin. Dx: E11.9 02/07/18   Hali Marry, MD  Insulin Syringes, Disposable, U-100 0.5 ML MISC Use to inject 60 units of insulin at bedtime.  DX : E11.9 06/16/18   Hali Marry, MD  metFORMIN (GLUCOPHAGE-XR) 500 MG 24 hr tablet Take 1 tablet (500 mg total) by mouth daily with breakfast. 07/13/19   Hali Marry, MD  metoprolol succinate (TOPROL-XL) 100 MG 24 hr tablet TAKE 1 TABLET BY MOUTH  DAILY 08/24/19   Hali Marry, MD  milk thistle 175 MG tablet Take 175 mg by mouth daily.    [provider]  ONE TOUCH LANCETS MISC Check blood sugar 3 times daily. One touch delica lancet 84T Dx: X64.6 02/07/18   Hali Marry, MD    Allergies    Ace inhibitors, Amoxicillin-pot clavulanate, Fluoxetine, and Propoxyphene hcl  Review of Systems   Review of Systems  Constitutional: Positive for fever.  HENT: Positive for sneezing. Negative for sore throat, trouble swallowing and voice change.   Respiratory: Negative for cough and shortness of breath.   Cardiovascular: Negative for chest pain.  Gastrointestinal: Negative for abdominal pain, blood in stool, constipation, diarrhea, nausea and vomiting.  Genitourinary: Negative for dysuria, flank pain, frequency and hematuria.  Musculoskeletal: Negative for back pain, neck pain and neck stiffness.  Neurological: Negative for dizziness, weakness, numbness and headaches.  All other systems reviewed and are negative.   Physical Exam Updated Vital Signs BP (!) 159/60 (BP Location: Left Arm)   Pulse (!) 118   Temp (!) 101.8 F (38.8 C) (Oral)   Resp 20   Ht 5' 2"  (1.575 m)   Wt 81.6 kg   LMP 07/10/2011   SpO2 98%   BMI 32.92 kg/m   Physical Exam Vitals and nursing note reviewed.  Constitutional:      General: She is not in acute distress.    Appearance: She is well-developed. She is obese. She is not diaphoretic.  HENT:     Head: Normocephalic and atraumatic.     Right Ear: External ear normal.     Left Ear: External ear normal.     Nose: Nose normal.     Right Sinus: No maxillary sinus tenderness or frontal sinus tenderness.     Left Sinus: No maxillary sinus tenderness or frontal sinus tenderness.     Mouth/Throat:     Mouth: Mucous membranes are moist.     Pharynx: Oropharynx is clear. Uvula midline.     Comments: No tenderness to the mouth or jaw.  No trismus.  No noted intraoral swelling or fluctuance.  Denies dental pain.  Tolerates oral secretions without noted difficulty. Eyes:     Conjunctiva/sclera: Conjunctivae  normal.  Neck:     Comments: No meningismus. Cardiovascular:     Rate and Rhythm: Normal rate and regular rhythm.     Pulses: Normal pulses.          Radial pulses are 2+ on the right side and 2+ on the left side.       Posterior tibial pulses are 2+ on the right side and 2+ on the left side.     Heart sounds: Normal heart sounds.     Comments: Tactile temperature in the extremities appropriate and equal bilaterally. Pulmonary:     Effort: Pulmonary effort is normal. No respiratory distress.     Breath sounds: Normal breath sounds.  Abdominal:     Palpations: Abdomen is soft.     Tenderness: There is no abdominal tenderness. There is no guarding.  Musculoskeletal:     Cervical back: Full passive range of motion without pain, normal range of motion and neck supple.     Right lower leg: No edema.     Left lower leg: No edema.  Lymphadenopathy:     Cervical: No cervical adenopathy.  Skin:    General: Skin is warm and dry.  Neurological:     Mental Status: She is alert.     Comments: She is able to answer my questions, but takes more time to do so. Sensation grossly intact to light touch in the extremities.   Grip strengths equal bilaterally.   Strength 5/5 in all extremities.   Coordination intact.  Cranial nerves III-XII grossly intact.  Handles oral secretions without noted difficulty.   No facial droop.   Psychiatric:        Mood and Affect: Mood and affect normal.        Speech: Speech normal.        Behavior: Behavior normal.     ED Results / Procedures / Treatments   Labs (all labs ordered are listed, but only abnormal results are displayed) Labs Reviewed  COMPREHENSIVE METABOLIC PANEL - Abnormal; Notable for the following components:      Result Value   Sodium 127 (*)    Potassium 2.8 (*)    Chloride 90 (*)    Glucose, Bld 341 (*)    Creatinine, Ser 1.11 (*)    GFR calc non Af Amer 54 (*)    All other components within normal limits  CBC WITH  DIFFERENTIAL/PLATELET - Abnormal; Notable for the following components:   WBC 17.3 (*)    Neutro Abs 15.2 (*)    Abs Immature Granulocytes 0.17 (*)    All other components within normal limits  URINALYSIS, ROUTINE W REFLEX MICROSCOPIC - Abnormal; Notable for the following components:   APPearance CLOUDY (*)    Glucose, UA >=500 (*)    Hgb urine dipstick SMALL (*)    Protein, ur >300 (*)    Leukocytes,Ua MODERATE (*)    All other components within normal limits  LACTIC ACID, PLASMA - Abnormal; Notable for the following components:   Lactic Acid, Venous 2.5 (*)    All other components within normal limits  URINALYSIS, MICROSCOPIC (REFLEX) - Abnormal; Notable for the following components:   Bacteria, UA FEW (*)    All other components within normal limits  SARS CORONAVIRUS 2 AG (30 MIN TAT)  GROUP A STREP BY PCR  URINE CULTURE  CULTURE, BLOOD (ROUTINE X 2)  CULTURE, BLOOD (ROUTINE X 2)  SARS CORONAVIRUS 2 (TAT 6-24 HRS)  LACTIC ACID, PLASMA    EKG None  Radiology DG Chest Portable 1 View  Result Date: 11/04/2019 CLINICAL DATA:  Fever cough EXAM: PORTABLE CHEST 1 VIEW COMPARISON:  11/04/2018 FINDINGS: The heart size and mediastinal contours are within normal limits. Both lungs are clear. The visualized skeletal structures are unremarkable. IMPRESSION: No active disease. Electronically Signed   By: Donavan Foil M.D.   On: 11/04/2019 17:58    Procedures Procedures (including critical care time)  Medications Ordered in ED Medications  potassium chloride 10 mEq in 100 mL IVPB (has no administration in time range)  potassium chloride SA (KLOR-CON) CR tablet 40 mEq (has no administration in time range)  sodium chloride 0.9 % bolus 1,000 mL (has  no administration in time range)  acetaminophen (TYLENOL) tablet 650 mg (650 mg Oral Given 11/04/19 1656)  sodium chloride 0.9 % bolus 1,000 mL (1,000 mLs Intravenous New Bag/Given 11/04/19 1657)    ED Course  I have reviewed the triage  vital signs and the nursing notes.  Pertinent labs & imaging results that were available during my care of the patient were reviewed by me and considered in my medical decision making (see chart for details).  Clinical Course as of Nov 04 1815  Wed Nov 04, 2019  1810 Corrects to 131 mEq/L Ron Parker, [id number]) or 133 mEq/L Kate Sable, 1999).  Sodium(!): 127 [SJ]    Clinical Course User Index [SJ] Ferlin Fairhurst, Helane Gunther, PA-C   MDM Rules/Calculators/A&P                       Patient presents with fever with rather vague additional symptoms. She is nontoxic appearing, not tachypneic, not hypotensive, maintains excellent SPO2 on room air, and is in no apparent distress.  Febrile with mild tachycardia. Leukocytosis.  Mild lactic acidosis. Hypokalemia addressed here in the ED. At this time, I do not have a source for her fever and leukocytosis.  She states she can follow-up with her PCP tomorrow or the next day.  She also already has an appointment scheduled with her PCP for Monday, March 1.  Findings and plan of care discussed with Blanchie Dessert, MD.   End of shift patient care handoff report given to Krista Blue, PA-C. Plan: Finish potassium and IV fluids.  Recheck lactic acid.  EKG pending.  Vitals:   11/04/19 1619 11/04/19 1620 11/04/19 1726  BP:  (!) 159/60 117/62  Pulse:  (!) 118 96  Resp:  20 (!) 26  Temp:  (!) 101.8 F (38.8 C) 99 F (37.2 C)  TempSrc:  Oral Oral  SpO2:  98% 99%  Weight: 81.6 kg    Height: 5' 2"  (1.575 m)       Final Clinical Impression(s) / ED Diagnoses Final diagnoses:  Febrile illness  Hypokalemia    Rx / DC Orders ED Discharge Orders    None       Layla Maw 11/04/19 Trecia Rogers, MD 11/04/19 [phone]

## 2019-11-04 NOTE — Discharge Instructions (Addendum)
Follow-up with your primary care provider within the next day or two. Hand washing: Wash your hands throughout the day, but especially before and after touching the face, using the restroom, sneezing, coughing, or touching surfaces that have been coughed or sneezed upon. Hydration: Symptoms of most illnesses will be intensified and complicated by dehydration. Dehydration can also extend the duration of symptoms. Drink plenty of fluids and get plenty of rest. You should be drinking at least half a liter of water an hour to stay hydrated. Electrolyte drinks (ex. Gatorade, Powerade, Pedialyte) are also encouraged. You should be drinking enough fluids to make your urine light yellow, almost clear. If this is not the case, you are not drinking enough water. Please note that some of the treatments indicated below will not be effective if you are not adequately hydrated. Diet: Please concentrate on hydration, however, you may introduce food slowly.  Start with a clear liquid diet, progressed to a full liquid diet, and then bland solids as you are able. Pain or fever: Acetaminophen (generic for Tylenol) for pain or fever.  Acetaminophen: May take acetaminophen (generic for Tylenol), as needed, for pain. Your daily total maximum amount of acetaminophen from all sources should be limited to 4000mg /day for persons without liver problems, or 2000mg /day for those with liver problems. Zyrtec or Claritin: May add these medication daily to control underlying symptoms of congestion, sneezing, and other signs of allergies.  These medications are available over-the-counter. Generics: Cetirizine (generic for Zyrtec) and loratadine (generic for Claritin). Fluticasone: Use fluticasone (generic for Flonase), as directed, for nasal and sinus congestion.  This medication is available over-the-counter. Congestion: Plain guaifenesin (generic for plain Mucinex) may help relieve congestion. Saline sinus rinses and saline nasal sprays  may also help relieve congestion. If you do not have high blood pressure, heart problems, or an allergy to such medications, you may also try phenylephrine or Sudafed. Sore throat: Warm liquids or Chloraseptic spray may help soothe a sore throat. Gargle twice a day with a salt water solution made from a half teaspoon of salt in a cup of warm water.  Follow up: Follow up with a primary care provider within the next day or two. Return: Return to the ED for significantly worsening symptoms, shortness of breath, persistent vomiting, large amounts of blood in stool, or any other major concerns.  For prescription assistance, may try using prescription discount sites or apps, such as goodrx.com

## 2019-11-04 NOTE — ED Notes (Signed)
States approx 4 hours ago started not feeling well, began having a fever, denies any pain at this time, states earlier had body aches and feeling cold

## 2019-11-04 NOTE — ED Notes (Signed)
This writer attempted to obtain blood from patient. This Clinical research associate stuck patient one time and patient has refused any more lab sticks.

## 2019-11-04 NOTE — ED Notes (Signed)
At discharge patient refused discharge vitals.

## 2019-11-04 NOTE — ED Notes (Signed)
Patient refused [facility] blood draw. Charge informed.

## 2019-11-04 NOTE — ED Provider Notes (Signed)
Received patient as a handoff at shift change from [place], [place].  HPI as obtained by handoff provider: Level 5 caveat due to difficulty communicating from previous stroke. Sabrina Dougherty is a 59 y.o. female, with a history of DM, stroke, aphasia, presenting to the ED with fever beginning earlier today.  She also notes sneezing and body aches. She denies cough, shortness of breath, chest pain, abdominal pain, N/V/D, urinary symptoms, neck stiffness, rash, sore throat, or any other complaints.  On my initial examination, while she has occasionally slowed ability to communicate, there is no altered mental status or cognition impairment.  She was able to speak coherently and communicate well.  While she endorses fever and body aches, she is denying any other symptoms that might otherwise point towards source of infection.  She is resting comfortably and feels prepared for discharge.  Will wait until second lactic acid obtained to ensure improvement.  Physical Exam  BP 120/68 (BP Location: Right Arm)   Pulse 95   Temp 98.9 F (37.2 C) (Oral)   Resp 18   Ht 5\' 2"  (1.575 m)   Wt 81.6 kg   LMP 07/10/2011   SpO2 99%   BMI 32.92 kg/m   Physical Exam Vitals and nursing note reviewed. Exam conducted with a chaperone present.  Constitutional:      General: She is not in acute distress.    Appearance: Normal appearance. She is obese. She is not ill-appearing or toxic-appearing.  HENT:     Head: Normocephalic and atraumatic.     Mouth/Throat:     Pharynx: Oropharynx is clear. No oropharyngeal exudate or posterior oropharyngeal erythema.  Eyes:     General: No scleral icterus.    Conjunctiva/sclera: Conjunctivae normal.  Neck:     Comments: No meningismus.  Demonstrates full ROM.  No midline TTP. Cardiovascular:     Rate and Rhythm: Normal rate and regular rhythm.     Pulses: Normal pulses.     Heart sounds: Normal heart sounds.  Pulmonary:     Effort: Pulmonary effort is  normal.     Breath sounds: Normal breath sounds.     Comments: No abnormal breath sounds auscultated on exam.  No increased work of breathing. Abdominal:     General: Abdomen is flat. There is no distension.     Palpations: Abdomen is soft.     Tenderness: There is no abdominal tenderness. There is no guarding.  Musculoskeletal:        General: Normal range of motion.     Cervical back: Normal range of motion and neck supple. No rigidity.  Skin:    General: Skin is dry.     Capillary Refill: Capillary refill takes less than 2 seconds.  Neurological:     Mental Status: She is alert and oriented to person, place, and time.     GCS: GCS eye subscore is 4. GCS verbal subscore is 5. GCS motor subscore is 6.     Gait: Gait normal.  Psychiatric:        Mood and Affect: Mood normal.        Behavior: Behavior normal.        Thought Content: Thought content normal.      ED Course/Procedures   Clinical Course as of Nov 04 21  Wed Nov 04, 2019  1810 Corrects to 131 mEq/L Nov 06, 2019, [phone]) or 133 mEq/L 2542, 1999).  Sodium(!): 127 [SJ]    Clinical Course User Index [SJ] Joy, Shawn C,  PA-C    Procedures Results for orders placed or performed during the hospital encounter of 11/04/19  SARS Coronavirus 2 Ag (30 min TAT) - Urine, Clean Catch   Specimen: Urine, Clean Catch; Nasal Swab  Result Value Ref Range   SARS Coronavirus 2 Ag NEGATIVE NEGATIVE  Group A Strep by PCR   Specimen: Urine, Clean Catch; Sterile Swab  Result Value Ref Range   Group A Strep by PCR NOT DETECTED NOT DETECTED  Comprehensive metabolic panel  Result Value Ref Range   Sodium 127 (L) 135 - 145 mmol/L   Potassium 2.8 (L) 3.5 - 5.1 mmol/L   Chloride 90 (L) 98 - 111 mmol/L   CO2 23 22 - 32 mmol/L   Glucose, Bld 341 (H) 70 - 99 mg/dL   BUN 19 6 - 20 mg/dL   Creatinine, Ser 4.09 (H) 0.44 - 1.00 mg/dL   Calcium 8.9 8.9 - 73.5 mg/dL   Total Protein 7.3 6.5 - 8.1 g/dL   Albumin 3.5 3.5 - 5.0 g/dL   AST 22 15 -  41 U/L   ALT 20 0 - 44 U/L   Alkaline Phosphatase 76 38 - 126 U/L   Total Bilirubin 0.6 0.3 - 1.2 mg/dL   GFR calc non Af Amer 54 (L) >60 mL/min   GFR calc Af Amer >60 >60 mL/min   Anion gap 14 5 - 15  CBC with Differential  Result Value Ref Range   WBC 17.3 (H) 4.0 - 10.5 K/uL   RBC 4.11 3.87 - 5.11 MIL/uL   Hemoglobin 12.2 12.0 - 15.0 g/dL   HCT 32.9 92.4 - 26.8 %   MCV 87.6 80.0 - 100.0 fL   MCH 29.7 26.0 - 34.0 pg   MCHC 33.9 30.0 - 36.0 g/dL   RDW 34.1 96.2 - 22.9 %   Platelets 279 150 - 400 K/uL   nRBC 0.0 0.0 - 0.2 %   Neutrophils Relative % 88 %   Neutro Abs 15.2 (H) 1.7 - 7.7 K/uL   Lymphocytes Relative 4 %   Lymphs Abs 0.8 0.7 - 4.0 K/uL   Monocytes Relative 6 %   Monocytes Absolute 1.0 0.1 - 1.0 K/uL   Eosinophils Relative 1 %   Eosinophils Absolute 0.1 0.0 - 0.5 K/uL   Basophils Relative 0 %   Basophils Absolute 0.1 0.0 - 0.1 K/uL   Immature Granulocytes 1 %   Abs Immature Granulocytes 0.17 (H) 0.00 - 0.07 K/uL  Urinalysis, Routine w reflex microscopic  Result Value Ref Range   Color, Urine YELLOW YELLOW   APPearance CLOUDY (A) CLEAR   Specific Gravity, Urine 1.015 1.005 - 1.030   pH 7.0 5.0 - 8.0   Glucose, UA >=500 (A) NEGATIVE mg/dL   Hgb urine dipstick SMALL (A) NEGATIVE   Bilirubin Urine NEGATIVE NEGATIVE   Ketones, ur NEGATIVE NEGATIVE mg/dL   Protein, ur >798 (A) NEGATIVE mg/dL   Nitrite NEGATIVE NEGATIVE   Leukocytes,Ua MODERATE (A) NEGATIVE  Lactic acid, plasma  Result Value Ref Range   Lactic Acid, Venous 2.5 (HH) 0.5 - 1.9 mmol/L  Urinalysis, Microscopic (reflex)  Result Value Ref Range   RBC / HPF 6-10 0 - 5 RBC/hpf   WBC, UA 11-20 0 - 5 WBC/hpf   Bacteria, UA FEW (A) NONE SEEN   Squamous Epithelial / LPF 6-10 0 - 5   DG Chest Portable 1 View  Result Date: 11/04/2019 CLINICAL DATA:  Fever cough EXAM: PORTABLE CHEST 1 VIEW COMPARISON:  11/04/2018 FINDINGS: The heart size and mediastinal contours are within normal limits. Both lungs are  clear. The visualized skeletal structures are unremarkable. IMPRESSION: No active disease. Electronically Signed   By: Donavan Foil M.D.   On: 11/04/2019 17:58     MDM   Per Arlean Hopping, PA-C Patient presents with fever with rather vague additional symptoms. She is nontoxic appearing, not tachypneic, not hypotensive, maintains excellent SPO2 on room air, and is in no apparent distress.  Febrile with mild tachycardia. Leukocytosis.  Mild lactic acidosis. Hypokalemia addressed here in the ED. At this time, I do not have a source for her fever and leukocytosis.  She states she can follow-up with her PCP tomorrow or the next day.  She also already has an appointment scheduled with her PCP for Monday, March 1.  Findings and plan of care discussed with Blanchie Dessert, MD. End of shift patient care handoff report given to Krista Blue, PA-C. Plan: Finish potassium and IV fluids.  Recheck lactic acid.  EKG pending.  I was waiting on a lactic acid.  However, evidently patient refused second lactic acid from nurse.  Will have a second nurse obtain lactic acid.  Discussed case with Dr. Maryan Rued who is familiar with case.  If trending down, patient is safe for discharge.  Otherwise, she will need to be admitted.  She is seeing her PCP in the morning.  On my exam, patient is resting comfortably.  There is no obvious cause of her fever today.  Repeated abdominal exam on multiple occasions without any focal tenderness.  She is in no acute distress.  Her low sodium was replenished with 1 L normal saline bolus and her hypokalemia was replenished with 40 mEq K-Dur and 10 mEq IV potassium.  Patient will need to follow-up with her primary care provider for repeat labs.    I was waiting on a repeat lactic acid, however patient evidently is refusing to comply according to the nurse.  I asked her and she simply does not want any further blood draws at this time.  I suspect that her mildly elevated lactic acid would  have improved with administration of IV fluids.  She is feeling improved since her initial presentation to the ED and informs me that she has an appointment with her primary care provider tomorrow.  She feels very prepared to go home and after discussion with Dr. Maryan Rued we feel as though that is appropriate at this time.  Encouraged increased fluids and Tylenol as needed for fever control.    I discussed very strict ED return precautions with the patient.  All of the evaluation and work-up results were discussed with the patient and any family at bedside. They were provided opportunity to ask any additional questions and have none at this time. They have expressed understanding of verbal discharge instructions as well as return precautions and are agreeable to the plan.    Shayli Altemose was evaluated in Emergency Department on 11/05/2019 for the symptoms described in the history of present illness. She was evaluated in the context of the global COVID-19 pandemic, which necessitated consideration that the patient might be at risk for infection with the SARS-CoV-2 virus that causes COVID-19. Institutional protocols and algorithms that pertain to the evaluation of patients at risk for COVID-19 are in a state of rapid change based on information released by regulatory bodies including the CDC and federal and state organizations. These policies and algorithms were followed during the patient's care in  the ED.    Lorelee New, PA-C 11/05/19 Maxie Barb, MD 11/07/19 [phone]

## 2019-11-05 ENCOUNTER — Ambulatory Visit: Payer: Managed Care, Other (non HMO) | Admitting: Family Medicine

## 2019-11-05 LAB — URINE CULTURE

## 2019-11-05 LAB — SARS CORONAVIRUS 2 (TAT 6-24 HRS): SARS Coronavirus 2: NEGATIVE

## 2019-11-06 ENCOUNTER — Ambulatory Visit: Payer: Managed Care, Other (non HMO) | Admitting: Family Medicine

## 2019-11-06 NOTE — Progress Notes (Deleted)
NO show

## 2019-11-09 ENCOUNTER — Encounter: Payer: Self-pay | Admitting: Family Medicine

## 2019-11-09 ENCOUNTER — Other Ambulatory Visit: Payer: Self-pay

## 2019-11-09 ENCOUNTER — Ambulatory Visit (INDEPENDENT_AMBULATORY_CARE_PROVIDER_SITE_OTHER): Payer: Managed Care, Other (non HMO) | Admitting: Family Medicine

## 2019-11-09 VITALS — BP 130/62 | HR 93 | Temp 98.6°F | Ht 62.0 in | Wt 177.0 lb

## 2019-11-09 DIAGNOSIS — I10 Essential (primary) hypertension: Secondary | ICD-10-CM | POA: Diagnosis not present

## 2019-11-09 DIAGNOSIS — N3000 Acute cystitis without hematuria: Secondary | ICD-10-CM

## 2019-11-09 DIAGNOSIS — R10827 Generalized rebound abdominal tenderness: Secondary | ICD-10-CM

## 2019-11-09 DIAGNOSIS — N069 Isolated proteinuria with unspecified morphologic lesion: Secondary | ICD-10-CM

## 2019-11-09 DIAGNOSIS — R509 Fever, unspecified: Secondary | ICD-10-CM

## 2019-11-09 DIAGNOSIS — E876 Hypokalemia: Secondary | ICD-10-CM

## 2019-11-09 DIAGNOSIS — R5383 Other fatigue: Secondary | ICD-10-CM | POA: Diagnosis not present

## 2019-11-09 DIAGNOSIS — E118 Type 2 diabetes mellitus with unspecified complications: Secondary | ICD-10-CM

## 2019-11-09 LAB — CULTURE, BLOOD (ROUTINE X 2)
Culture: NO GROWTH
Culture: NO GROWTH
Special Requests: ADEQUATE

## 2019-11-09 LAB — POCT URINALYSIS DIP (CLINITEK)
Bilirubin, UA: NEGATIVE
Glucose, UA: 100 mg/dL — AB
Ketones, POC UA: NEGATIVE mg/dL
Nitrite, UA: NEGATIVE
POC PROTEIN,UA: 100 — AB
Spec Grav, UA: 1.015 (ref 1.010–1.025)
Urobilinogen, UA: 1 E.U./dL
pH, UA: 6 (ref 5.0–8.0)

## 2019-11-09 MED ORDER — SULFAMETHOXAZOLE-TRIMETHOPRIM 800-160 MG PO TABS: 1.0000 | ORAL_TABLET | Freq: Two times a day (BID) | ORAL | 0 refills | Status: DC

## 2019-11-09 MED ORDER — FLUCONAZOLE 150 MG PO TABS: 150.0000 mg | ORAL_TABLET | Freq: Once | ORAL | 0 refills | Status: DC

## 2019-11-09 NOTE — Assessment & Plan Note (Signed)
Pressure here today actually looks great.  Just will monitor carefully.

## 2019-11-09 NOTE — Addendum Note (Signed)
Addended by: Nani Gasser D on: 11/09/2019 04:34 PM   Modules accepted: Orders

## 2019-11-09 NOTE — Assessment & Plan Note (Signed)
He did have a large amount of glucose in her urine when she went to the ED about a week ago and she is not on a flow 7 which would dump a large amount of glucose into the urine.  We will go ahead and repeat urinalysis today.

## 2019-11-09 NOTE — Progress Notes (Signed)
She reports having headache for the past 2 months. She stated that it is at the top of her head. She denies any visual changes. And takes aleve for the pain which helps.   She also said that the HCTZ 12.5 mg is ineffective.

## 2019-11-09 NOTE — Progress Notes (Signed)
ED Follow up   Established Patient Office Visit  Subjective:  Patient ID: Sabrina Dougherty, female    DOB: [birth year]  Age: 59 y.o. MRN: [id number]  CC:  Chief Complaint  Patient presents with  . Headache    HPI Sabrina Dougherty presents for still not feeling well.  She was actually seen in the emergency department on February 24.  Presented with fever sneezing and body aches as well as some mild abdominal discomfort.  She tested negative for Covid but was found to have a low potassium as well as a low sodium level.  Creatinine was slightly elevated at 1.1.  She was given potassium.  She had significant levels of protein on her urine as well as an elevated lactic acid.  He also had greater than 500 on her glucose levels as well.  She says since being home she still been running some low-grade temperatures usually around 99.  When she runs a temperature she will get some chills.  She says she feels like it comes back as soon as the ibuprofen Aleve that she is taking wears off.  Denies any cough or shortness of breath.  She denies any upper respiratory symptoms such as sinus congestion etc.  She denies any dysuria or sore throat or abdominal pain.  She says she is just felt really fatigued and tired she has been sleeping well at night.  She is also been having intermittent headaches on the top of her head for the last couple of weeks.  He also reports that her home blood pressures have been running a little higher.  Past Medical History:  Diagnosis Date  . Diabetes mellitus   . Pancreatitis   . Stroke [facility]) 1990s   left middle cerebral artery stroke  . Stroke ([place]) 02/2012   left temporal infarct    No past surgical history on file.  Family History  Problem Relation Age of Onset  . Microcephaly Daughter        Seckel syndrome  . Alcohol abuse Mother   . Diabetes Sister     Social History   Socioeconomic History  . Marital status: Married    Spouse name: Not on file   . Number of children: Not on file  . Years of education: Not on file  . Highest education level: Not on file  Occupational History  . Not on file  Tobacco Use  . Smoking status: Current Every Day Smoker    Packs/day: 0.10    Types: Cigarettes  . Smokeless tobacco: Never Used  Substance and Sexual Activity  . Alcohol use: Yes    Comment: occ  . Drug use: No  . Sexual activity: Not on file  Other Topics Concern  . Not on file  Social History Narrative   Lives with her husband and daughter who is disabled. Exercises some.    Social Determinants of Health   Financial Resource Strain:   . Difficulty of Paying Living Expenses: Not on file  Food Insecurity:   . Worried About Charity fundraiser in the Last Year: Not on file  . Ran Out of Food in the Last Year: Not on file  Transportation Needs:   . Lack of Transportation (Medical): Not on file  . Lack of Transportation (Non-Medical): Not on file  Physical Activity:   . Days of Exercise per Week: Not on file  . Minutes of Exercise per Session: Not on file  Stress:   . Feeling of Stress :  Not on file  Social Connections:   . Frequency of Communication with Friends and Family: Not on file  . Frequency of Social Gatherings with Friends and Family: Not on file  . Attends Religious Services: Not on file  . Active Member of Clubs or Organizations: Not on file  . Attends Archivist Meetings: Not on file  . Marital Status: Not on file  Intimate Partner Violence:   . Fear of Current or Ex-Partner: Not on file  . Emotionally Abused: Not on file  . Physically Abused: Not on file  . Sexually Abused: Not on file    Outpatient Medications Prior to Visit  Medication Sig Dispense Refill  . Alcohol Swabs (ALCOHOL PREP) PADS Use to clean area prior to injection 600 each 1  . AMBULATORY NON FORMULARY MEDICATION Medication Name: One touch Verio Flex kit DX:E11.9 For testing 3 times a day 1 each 0  . amLODipine (NORVASC) 10 MG  tablet TAKE 1 TABLET BY MOUTH  DAILY 90 tablet 3  . aspirin 81 MG tablet Take 81 mg by mouth daily.    Marland Kitchen atorvastatin (LIPITOR) 80 MG tablet Take 1 tablet (80 mg total) by mouth daily at 6 PM. 90 tablet 3  . clopidogrel (PLAVIX) 75 MG tablet Take 1 tablet (75 mg total) by mouth daily. 90 tablet 1  . glucose blood (ACCU-CHEK AVIVA PLUS) test strip For testing blood sugars 3 times daily. Dx:E11.9 300 each 4  . ibuprofen (ADVIL,MOTRIN) 600 MG tablet take 1 tablet by mouth every 6 to 8 hours if needed 90 tablet 2  . insulin glargine (LANTUS) 100 UNIT/ML injection INJECT SUBCUTANEOUSLY 60  UNITS AT BEDTIME 60 mL 2  . Insulin Pen Needle (1ST TIER UNIFINE PENTIPS) 31G X 6 MM MISC For use when injecting insulin. Dx: E11.9 300 each 1  . Insulin Syringes, Disposable, U-100 0.5 ML MISC Use to inject 60 units of insulin at bedtime.  DX : E11.9 100 each prn  . metFORMIN (GLUCOPHAGE-XR) 500 MG 24 hr tablet Take 1 tablet (500 mg total) by mouth daily with breakfast. 90 tablet 1  . metoprolol succinate (TOPROL-XL) 100 MG 24 hr tablet TAKE 1 TABLET BY MOUTH  DAILY 90 tablet 3  . milk thistle 175 MG tablet Take 175 mg by mouth daily.    . ONE TOUCH LANCETS MISC Check blood sugar 3 times daily. One touch delica lancet 70W Dx: C37.6 600 each 4  . hydrochlorothiazide (MICROZIDE) 12.5 MG capsule Take 1 capsule (12.5 mg total) by mouth daily. (Patient not taking: Reported on 11/09/2019) 90 capsule 1   No facility-administered medications prior to visit.    Allergies  Allergen Reactions  . Ace Inhibitors   . Amoxicillin-Pot Clavulanate     REACTION: skin peeling  . Fluoxetine Other (See Comments)    sedation  . Propoxyphene Hcl     ROS Review of Systems    Objective:    Physical Exam  Constitutional: She is oriented to person, place, and time. She appears well-developed and well-nourished.  HENT:  Head: Normocephalic and atraumatic.  Cardiovascular: Normal rate, regular rhythm and normal heart sounds.   Pulmonary/Chest: Effort normal and breath sounds normal.  Abdominal: Soft. Bowel sounds are normal. She exhibits no distension and no mass. There is abdominal tenderness. There is no rebound and no guarding.  + TTP diffusely but mostly in the RUQ and LUQ  Neurological: She is alert and oriented to person, place, and time.  Skin: Skin is warm  and dry.  Psychiatric: She has a normal mood and affect. Her behavior is normal.    BP 130/62   Pulse 93   Temp 98.6 F (37 C)   Ht 5' 2"  (1.575 m)   Wt 177 lb (80.3 kg)   LMP 07/10/2011   SpO2 100%   BMI 32.37 kg/m  Wt Readings from Last 3 Encounters:  11/09/19 177 lb (80.3 kg)  11/04/19 180 lb (81.6 kg)  08/18/19 183 lb (83 kg)     There are no preventive care reminders to display for this patient.  There are no preventive care reminders to display for this patient.  Lab Results  Component Value Date   TSH 1.110 01/16/2018   Lab Results  Component Value Date   WBC 17.3 (H) 11/04/2019   HGB 12.2 11/04/2019   HCT 36.0 11/04/2019   MCV 87.6 11/04/2019   PLT 279 11/04/2019   Lab Results  Component Value Date   NA 127 (L) 11/04/2019   K 2.8 (L) 11/04/2019   CO2 23 11/04/2019   GLUCOSE 341 (H) 11/04/2019   BUN 19 11/04/2019   CREATININE 1.11 (H) 11/04/2019   BILITOT 0.6 11/04/2019   ALKPHOS 76 11/04/2019   AST 22 11/04/2019   ALT 20 11/04/2019   PROT 7.3 11/04/2019   ALBUMIN 3.5 11/04/2019   CALCIUM 8.9 11/04/2019   ANIONGAP 14 11/04/2019   Lab Results  Component Value Date   CHOL 190 04/10/2019   Lab Results  Component Value Date   HDL 75 04/10/2019   Lab Results  Component Value Date   LDLCALC 73 04/10/2019   Lab Results  Component Value Date   TRIG 211 (H) 04/10/2019   Lab Results  Component Value Date   CHOLHDL 2.5 04/10/2019   Lab Results  Component Value Date   HGBA1C 7.8 (H) 08/14/2019      Assessment & Plan:   Problem List Items Addressed This Visit      Cardiovascular and Mediastinum    HYPERTENSION, BENIGN SYSTEMIC - Primary    Pressure here today actually looks great.  Just will monitor carefully.      Relevant Orders   CBC with Differential   CMP14+EGFR   Lactic acid, plasma   TSH     Endocrine   Type 2 diabetes mellitus with complications ([place])    He did have a large amount of glucose in her urine when she went to the ED about a week ago and she is not on a flow 7 which would dump a large amount of glucose into the urine.  We will go ahead and repeat urinalysis today.       Other Visit Diagnoses    Fatigue, unspecified type       Relevant Orders   CBC with Differential   CMP14+EGFR   Lactic acid, plasma   TSH   Generalized abdominal tenderness with rebound tenderness       Relevant Orders   CBC with Differential   CMP14+EGFR   Lactic acid, plasma   TSH   Fever, unspecified fever cause       Relevant Orders   CBC with Differential   CMP14+EGFR   Lactic acid, plasma   TSH   Hypokalemia       Relevant Orders   CBC with Differential   CMP14+EGFR   Lactic acid, plasma   TSH   Isolated proteinuria with morphologic lesion         Hypokalemia-due to recheck labs  from recent ED visit.  Fever-still having persistent fevers without a specific cause.  Will repeat CBC with differential.  Generalized abdominal tenderness-even though she reports no change in bowel movements and no significant abdominal discomfort she was definitely tender on exam.  Consider further evaluation with CT abdomen if she is not improving or if her white count is elevated.  Meds ordered this encounter  Medications  . DISCONTD: fluconazole (DIFLUCAN) 150 MG tablet    Sig: Take 1 tablet (150 mg total) by mouth once for 1 dose.    Dispense:  1 tablet    Refill:  0    Follow-up: Return in about 1 month (around 12/10/2019) for DM/BP.    Beatrice Lecher, MD

## 2019-11-09 NOTE — Addendum Note (Signed)
Addended by: Deno Etienne on: 11/09/2019 04:28 PM   Modules accepted: Orders

## 2019-11-12 ENCOUNTER — Ambulatory Visit: Payer: Managed Care, Other (non HMO) | Admitting: Family Medicine

## 2019-11-12 LAB — URINALYSIS W MICROSCOPIC + REFLEX CULTURE
Bilirubin Urine: NEGATIVE
Hyaline Cast: NONE SEEN /LPF
Ketones, ur: NEGATIVE
Nitrites, Initial: NEGATIVE
Specific Gravity, Urine: 1.019 (ref 1.001–1.03)
WBC, UA: >=60 /HPF — AB (ref 0–5)
pH: 5.5 (ref 5.0–8.0)

## 2019-11-12 LAB — URINE CULTURE
MICRO NUMBER:: 10203805
SPECIMEN QUALITY:: ADEQUATE

## 2019-11-12 LAB — CULTURE INDICATED

## 2019-11-23 ENCOUNTER — Telehealth: Payer: Self-pay

## 2019-11-23 NOTE — Telephone Encounter (Signed)
Okay to hold Plavix for 5 days before surgery.  We already faxed their office paperwork in regards to this so I am not sure why they are calling.

## 2019-11-23 NOTE — Telephone Encounter (Signed)
Patient/husband advised

## 2019-11-23 NOTE — Telephone Encounter (Signed)
Christen Bame states Sabrina Dougherty is having a tooth pulled on Saturday. The dentist is recommending she stop Plavix 5 days prior. Please advise.

## 2019-12-07 ENCOUNTER — Ambulatory Visit (INDEPENDENT_AMBULATORY_CARE_PROVIDER_SITE_OTHER): Payer: Managed Care, Other (non HMO) | Admitting: Family Medicine

## 2019-12-07 ENCOUNTER — Other Ambulatory Visit: Payer: Self-pay

## 2019-12-07 ENCOUNTER — Encounter: Payer: Self-pay | Admitting: Family Medicine

## 2019-12-07 VITALS — BP 141/59 | HR 71 | Ht 61.81 in | Wt 180.0 lb

## 2019-12-07 DIAGNOSIS — E118 Type 2 diabetes mellitus with unspecified complications: Secondary | ICD-10-CM | POA: Diagnosis not present

## 2019-12-07 DIAGNOSIS — I10 Essential (primary) hypertension: Secondary | ICD-10-CM | POA: Diagnosis not present

## 2019-12-07 LAB — POCT GLYCOSYLATED HEMOGLOBIN (HGB A1C): Hemoglobin A1C: 8.9 % — AB (ref 4.0–5.6)

## 2019-12-07 NOTE — Patient Instructions (Signed)
Decrease your Lantus to 55 units since you are having some low blood sugars. Continue to work on healthy diet and regular exercise.  Really cut back on carbs including bread, potatoes, pasta, sweet beverages, crackers etc.

## 2019-12-07 NOTE — Progress Notes (Signed)
Established Patient Office Visit  Subjective:  Patient ID: Sabrina Dougherty, female    DOB: [birth year]  Age: 59 y.o. MRN: [id number]  CC: No chief complaint on file.   HPI Yeilin Zweber presents for   Hypertension- Pt denies chest pain, SOB, dizziness, or heart palpitations.  Taking meds as directed w/o problems.  Denies medication side effects.    F/U Diabetes -reports that she is recently started exercising again.  She is trying to eat a little bit more protein in her diet.  She has had a couple of lows 1 in the middle the night about a week ago and 1 while exercising.  She is currently on 60 units of Lantus and takes Metformin.  She did get her eye exam done a couple months ago with Dr. Tora Perches in [facility].  Past Medical History:  Diagnosis Date  . Diabetes mellitus   . Pancreatitis   . Stroke [facility]) 1990s   left middle cerebral artery stroke  . Stroke [facility]) 02/2012   left temporal infarct    History reviewed. No pertinent surgical history.  Family History  Problem Relation Age of Onset  . Microcephaly Daughter        Seckel syndrome  . Alcohol abuse Mother   . Diabetes Sister     Social History   Socioeconomic History  . Marital status: Married    Spouse name: Not on file  . Number of children: Not on file  . Years of education: Not on file  . Highest education level: Not on file  Occupational History  . Not on file  Tobacco Use  . Smoking status: Current Every Day Smoker    Packs/day: 0.10    Types: Cigarettes  . Smokeless tobacco: Never Used  Substance and Sexual Activity  . Alcohol use: Yes    Comment: occ  . Drug use: No  . Sexual activity: Not on file  Other Topics Concern  . Not on file  Social History Narrative   Lives with her husband and daughter who is disabled. Exercises some.    Social Determinants of Health   Financial Resource Strain:   . Difficulty of Paying Living Expenses:   Food Insecurity:   . Worried About  Charity fundraiser in the Last Year:   . Arboriculturist in the Last Year:   Transportation Needs:   . Film/video editor (Medical):   Marland Kitchen Lack of Transportation (Non-Medical):   Physical Activity:   . Days of Exercise per Week:   . Minutes of Exercise per Session:   Stress:   . Feeling of Stress :   Social Connections:   . Frequency of Communication with Friends and Family:   . Frequency of Social Gatherings with Friends and Family:   . Attends Religious Services:   . Active Member of Clubs or Organizations:   . Attends Archivist Meetings:   Marland Kitchen Marital Status:   Intimate Partner Violence:   . Fear of Current or Ex-Partner:   . Emotionally Abused:   Marland Kitchen Physically Abused:   . Sexually Abused:     Outpatient Medications Prior to Visit  Medication Sig Dispense Refill  . Alcohol Swabs (ALCOHOL PREP) PADS Use to clean area prior to injection 600 each 1  . AMBULATORY NON FORMULARY MEDICATION Medication Name: One touch Verio Flex kit DX:E11.9 For testing 3 times a day 1 each 0  . amLODipine (NORVASC) 10 MG tablet TAKE 1 TABLET BY  MOUTH  DAILY 90 tablet 3  . aspirin 81 MG tablet Take 81 mg by mouth daily.    Marland Kitchen atorvastatin (LIPITOR) 80 MG tablet Take 1 tablet (80 mg total) by mouth daily at 6 PM. 90 tablet 3  . clopidogrel (PLAVIX) 75 MG tablet Take 1 tablet (75 mg total) by mouth daily. 90 tablet 1  . glucose blood (ACCU-CHEK AVIVA PLUS) test strip For testing blood sugars 3 times daily. Dx:E11.9 300 each 4  . insulin glargine (LANTUS) 100 UNIT/ML injection INJECT SUBCUTANEOUSLY 60  UNITS AT BEDTIME 60 mL 2  . Insulin Pen Needle (1ST TIER UNIFINE PENTIPS) 31G X 6 MM MISC For use when injecting insulin. Dx: E11.9 300 each 1  . Insulin Syringes, Disposable, U-100 0.5 ML MISC Use to inject 60 units of insulin at bedtime.  DX : E11.9 100 each prn  . metFORMIN (GLUCOPHAGE-XR) 500 MG 24 hr tablet Take 1 tablet (500 mg total) by mouth daily with breakfast. 90 tablet 1  .  metoprolol succinate (TOPROL-XL) 100 MG 24 hr tablet TAKE 1 TABLET BY MOUTH  DAILY 90 tablet 3  . ONE TOUCH LANCETS MISC Check blood sugar 3 times daily. One touch delica lancet 16X Dx: W96.0 600 each 4  . ibuprofen (ADVIL,MOTRIN) 600 MG tablet take 1 tablet by mouth every 6 to 8 hours if needed (Patient not taking: Reported on 12/07/2019) 90 tablet 2  . milk thistle 175 MG tablet Take 175 mg by mouth daily.    Marland Kitchen sulfamethoxazole-trimethoprim (BACTRIM DS) 800-160 MG tablet Take 1 tablet by mouth 2 (two) times daily. 14 tablet 0   No facility-administered medications prior to visit.    Allergies  Allergen Reactions  . Ace Inhibitors   . Amoxicillin-Pot Clavulanate     REACTION: skin peeling  . Fluoxetine Other (See Comments)    sedation  . Propoxyphene Hcl     ROS Review of Systems    Objective:    Physical Exam  BP (!) 141/59 (BP Location: Left Arm, Patient Position: Sitting, Cuff Size: Normal)   Pulse 71   Ht 5' 1.81" (1.57 m)   Wt 180 lb (81.6 kg)   LMP 07/10/2011   BMI 33.12 kg/m  Wt Readings from Last 3 Encounters:  12/07/19 180 lb (81.6 kg)  11/09/19 177 lb (80.3 kg)  11/04/19 180 lb (81.6 kg)     There are no preventive care reminders to display for this patient.  There are no preventive care reminders to display for this patient.  Lab Results  Component Value Date   TSH 1.110 01/16/2018   Lab Results  Component Value Date   WBC 17.3 (H) 11/04/2019   HGB 12.2 11/04/2019   HCT 36.0 11/04/2019   MCV 87.6 11/04/2019   PLT 279 11/04/2019   Lab Results  Component Value Date   NA 127 (L) 11/04/2019   K 2.8 (L) 11/04/2019   CO2 23 11/04/2019   GLUCOSE 341 (H) 11/04/2019   BUN 19 11/04/2019   CREATININE 1.11 (H) 11/04/2019   BILITOT 0.6 11/04/2019   ALKPHOS 76 11/04/2019   AST 22 11/04/2019   ALT 20 11/04/2019   PROT 7.3 11/04/2019   ALBUMIN 3.5 11/04/2019   CALCIUM 8.9 11/04/2019   ANIONGAP 14 11/04/2019   Lab Results  Component Value Date    CHOL 190 04/10/2019   Lab Results  Component Value Date   HDL 75 04/10/2019   Lab Results  Component Value Date   LDLCALC 73 04/10/2019  Lab Results  Component Value Date   TRIG 211 (H) 04/10/2019   Lab Results  Component Value Date   CHOLHDL 2.5 04/10/2019   Lab Results  Component Value Date   HGBA1C 8.9 (A) 12/07/2019      Assessment & Plan:   Problem List Items Addressed This Visit      Cardiovascular and Mediastinum   HYPERTENSION, BENIGN SYSTEMIC - Primary    Pressure still mildly elevated though it was at goal at her last office visit but she is up 3 pounds.  She says she feels committed to getting back into working out which she actually started doing last week as well as working on her diet.        Endocrine   Type 2 diabetes mellitus with complications (HCC)    M4B increased today up to 8.9 from previous of 7.8, 3 months ago.she is using 60 units of Lantus and she is also on Metformin.  Unfortunately she has been having a couple of hypoglycemic events organ a decrease her Lantus down to 55 units.  We also discussed adding a second oral agent but she declined and says she is really committed to working on diet and exercise over the next 3 months.  I am willing to give her 90 days to work on it but just reminded her that right now her blood sugars are really uncontrolled it is really essential that we get this under better control.      Relevant Orders   POCT HgB A1C (Completed)      No orders of the defined types were placed in this encounter.   Follow-up: Return in about 3 months (around 03/08/2020).    Beatrice Lecher, MD

## 2019-12-07 NOTE — Assessment & Plan Note (Addendum)
A1c increased today up to 8.9 from previous of 7.8, 3 months ago.she is using 60 units of Lantus and she is also on Metformin.  Unfortunately she has been having a couple of hypoglycemic events organ a decrease her Lantus down to 55 units.  We also discussed adding a second oral agent but she declined and says she is really committed to working on diet and exercise over the next 3 months.  I am willing to give her 90 days to work on it but just reminded her that right now her blood sugars are really uncontrolled it is really essential that we get this under better control.

## 2019-12-07 NOTE — Assessment & Plan Note (Signed)
Pressure still mildly elevated though it was at goal at her last office visit but she is up 3 pounds.  She says she feels committed to getting back into working out which she actually started doing last week as well as working on her diet.

## 2019-12-08 LAB — CBC WITH DIFFERENTIAL/PLATELET
Basophils Absolute: 0.1 10*3/uL (ref 0.0–0.2)
Basos: 1 %
EOS (ABSOLUTE): 0.3 10*3/uL (ref 0.0–0.4)
Eos: 4 %
Hematocrit: 38.5 % (ref 34.0–46.6)
Hemoglobin: 13.3 g/dL (ref 11.1–15.9)
Immature Grans (Abs): 0 10*3/uL (ref 0.0–0.1)
Immature Granulocytes: 1 %
Lymphocytes Absolute: 3 10*3/uL (ref 0.7–3.1)
Lymphs: 35 %
MCH: 29.4 pg (ref 26.6–33.0)
MCHC: 34.5 g/dL (ref 31.5–35.7)
MCV: 85 fL (ref 79–97)
Monocytes Absolute: 0.6 10*3/uL (ref 0.1–0.9)
Monocytes: 7 %
Neutrophils Absolute: 4.6 10*3/uL (ref 1.4–7.0)
Neutrophils: 52 %
Platelets: 304 10*3/uL (ref 150–450)
RBC: 4.53 x10E6/uL (ref 3.77–5.28)
RDW: 13.9 % (ref 11.7–15.4)
WBC: 8.7 10*3/uL (ref 3.4–10.8)

## 2019-12-08 LAB — CMP14+EGFR
ALT: 11 IU/L (ref 0–32)
AST: 14 IU/L (ref 0–40)
Albumin/Globulin Ratio: 1.7 (ref 1.2–2.2)
Albumin: 4.3 g/dL (ref 3.8–4.9)
Alkaline Phosphatase: 95 IU/L (ref 39–117)
BUN/Creatinine Ratio: 18 (ref 12–28)
BUN: 15 mg/dL (ref 8–27)
Bilirubin Total: 0.3 mg/dL (ref 0.0–1.2)
CO2: 25 mmol/L (ref 20–29)
Calcium: 9.5 mg/dL (ref 8.7–10.3)
Chloride: 95 mmol/L — ABNORMAL LOW (ref 96–106)
Creatinine, Ser: 0.84 mg/dL (ref 0.57–1.00)
GFR calc Af Amer: 87 mL/min/{1.73_m2} (ref >59)
GFR calc non Af Amer: 76 mL/min/{1.73_m2} (ref >59)
Globulin, Total: 2.6 g/dL (ref 1.5–4.5)
Glucose: 305 mg/dL — ABNORMAL HIGH (ref 65–99)
Potassium: 3.8 mmol/L (ref 3.5–5.2)
Sodium: 138 mmol/L (ref 134–144)
Total Protein: 6.9 g/dL (ref 6.0–8.5)

## 2019-12-08 LAB — TSH: TSH: 2.12 u[IU]/mL (ref 0.450–4.500)

## 2019-12-08 LAB — LACTIC ACID, PLASMA: Lactate, Ven: 18 mg/dL (ref 4.8–25.7)

## 2019-12-09 ENCOUNTER — Encounter: Payer: Self-pay | Admitting: Family Medicine

## 2020-01-28 ENCOUNTER — Other Ambulatory Visit: Payer: Self-pay | Admitting: Family Medicine

## 2020-01-28 DIAGNOSIS — I10 Essential (primary) hypertension: Secondary | ICD-10-CM

## 2020-01-28 DIAGNOSIS — E118 Type 2 diabetes mellitus with unspecified complications: Secondary | ICD-10-CM

## 2020-01-29 ENCOUNTER — Other Ambulatory Visit: Payer: Self-pay | Admitting: *Deleted

## 2020-02-04 ENCOUNTER — Telehealth: Payer: Self-pay

## 2020-02-04 NOTE — Telephone Encounter (Signed)
Husband called and left message regarding something with Dexcom meter. Unable to make out what the need/request was, so I called back and left VM asking for clarification

## 2020-02-05 NOTE — Telephone Encounter (Signed)
Nasrin's husband called back and left a message. Message was unclear. I called and left a message for a return call.

## 2020-02-11 ENCOUNTER — Other Ambulatory Visit: Payer: Self-pay | Admitting: Family Medicine

## 2020-02-15 NOTE — Telephone Encounter (Signed)
Sabrina Dougherty's husband wants her to have Dexcom meter. Please advise.

## 2020-02-15 NOTE — Telephone Encounter (Signed)
Call their insurance and see if it is covered first.  We tried to write for this meter for several patients and have yet to be able to get it covered.

## 2020-02-16 NOTE — Telephone Encounter (Signed)
Left message advising of recommendations.  

## 2020-02-29 ENCOUNTER — Encounter: Payer: Self-pay | Admitting: Family Medicine

## 2020-02-29 ENCOUNTER — Ambulatory Visit (INDEPENDENT_AMBULATORY_CARE_PROVIDER_SITE_OTHER): Payer: Managed Care, Other (non HMO) | Admitting: Family Medicine

## 2020-02-29 ENCOUNTER — Other Ambulatory Visit: Payer: Self-pay

## 2020-02-29 VITALS — BP 141/58 | HR 78 | Ht 62.0 in | Wt 176.0 lb

## 2020-02-29 DIAGNOSIS — E118 Type 2 diabetes mellitus with unspecified complications: Secondary | ICD-10-CM

## 2020-02-29 DIAGNOSIS — I6930 Unspecified sequelae of cerebral infarction: Secondary | ICD-10-CM

## 2020-02-29 DIAGNOSIS — I10 Essential (primary) hypertension: Secondary | ICD-10-CM | POA: Diagnosis not present

## 2020-02-29 LAB — POCT UA - MICROALBUMIN
Albumin/Creatinine Ratio, Urine, POC: >300
Creatinine, POC: 10 mg/dL
Microalbumin Ur, POC: 150 mg/L

## 2020-02-29 LAB — POCT GLYCOSYLATED HEMOGLOBIN (HGB A1C): Hemoglobin A1C: 8.1 % — AB (ref 4.0–5.6)

## 2020-02-29 MED ORDER — DEXCOM G6 TRANSMITTER MISC: 99 refills | Status: DC

## 2020-02-29 MED ORDER — DEXCOM G6 SENSOR MISC: 3 refills | Status: DC

## 2020-02-29 MED ORDER — CONTOUR TEST VI STRP: ORAL_STRIP | 3 refills | Status: DC

## 2020-02-29 NOTE — Progress Notes (Signed)
Established Patient Office Visit  Subjective:  Patient ID: Sabrina Dougherty, female    DOB: Sep 10, 1961  Age: 59 y.o. MRN: [id number]  CC:  Chief Complaint  Patient presents with  . Diabetes  . Hypertension    HPI [name] presents for   Hypertension- Pt denies chest pain, SOB, dizziness, or heart palpitations.  Taking meds as directed w/o problems.  Denies medication side effects.    Diabetes - no hypoglycemic events. No wounds or sores that are not healing well. No increased thirst or urination. Checking glucose at home. Taking medications as prescribed without any side effects.  She is interested in getting the Dexcom meter and sensor so that she can monitor for hypoglycemic episodes at night.  She did call her insurance and they said that it would require prior authorization.  She did start exercising recently about 5 days a week she has been doing it for about 6 weeks total at this point.  She has had a few hypoglycemic events I decreased her insulin from 60 units down to 40 units.  She says even on the 40 units the last 2 weeks she has had a couple of lows.  She also needs a refill on Contour meter strips for her machine until she is able to get the Dexcom approved.   Past Medical History:  Diagnosis Date  . Diabetes mellitus   . Pancreatitis   . Stroke [facility]) 1990s   left middle cerebral artery stroke  . Stroke [facility]) 02/2012   left temporal infarct    History reviewed. No pertinent surgical history.  Family History  Problem Relation Age of Onset  . Microcephaly Daughter        Seckel syndrome  . Alcohol abuse Mother   . Diabetes Sister     Social History   Socioeconomic History  . Marital status: Married    Spouse name: Not on file  . Number of children: Not on file  . Years of education: Not on file  . Highest education level: Not on file  Occupational History  . Not on file  Tobacco Use  . Smoking status: Current Every Day Smoker     Packs/day: 0.10    Types: Cigarettes  . Smokeless tobacco: Never Used  Vaping Use  . Vaping Use: Never used  Substance and Sexual Activity  . Alcohol use: Yes    Comment: occ  . Drug use: No  . Sexual activity: Not on file  Other Topics Concern  . Not on file  Social History Narrative   Lives with her husband and daughter who is disabled. Exercises some.    Social Determinants of Health   Financial Resource Strain:   . Difficulty of Paying Living Expenses:   Food Insecurity:   . Worried About Charity fundraiser in the Last Year:   . Arboriculturist in the Last Year:   Transportation Needs:   . Film/video editor (Medical):   Marland Kitchen Lack of Transportation (Non-Medical):   Physical Activity:   . Days of Exercise per Week:   . Minutes of Exercise per Session:   Stress:   . Feeling of Stress :   Social Connections:   . Frequency of Communication with Friends and Family:   . Frequency of Social Gatherings with Friends and Family:   . Attends Religious Services:   . Active Member of Clubs or Organizations:   . Attends Archivist Meetings:   Marland Kitchen Marital  Status:   Intimate Partner Violence:   . Fear of Current or Ex-Partner:   . Emotionally Abused:   Marland Kitchen Physically Abused:   . Sexually Abused:     Outpatient Medications Prior to Visit  Medication Sig Dispense Refill  . Alcohol Swabs (ALCOHOL PREP) PADS Use to clean area prior to injection 600 each 1  . AMBULATORY NON FORMULARY MEDICATION Medication Name: One touch Verio Flex kit DX:E11.9 For testing 3 times a day 1 each 0  . amLODipine (NORVASC) 10 MG tablet TAKE 1 TABLET BY MOUTH  DAILY 90 tablet 3  . aspirin 81 MG tablet Take 81 mg by mouth daily.    . clopidogrel (PLAVIX) 75 MG tablet Take 1 tablet (75 mg total) by mouth daily. 90 tablet 1  . insulin glargine (LANTUS) 100 UNIT/ML injection INJECT SUBCUTANEOUSLY 60  UNITS AT BEDTIME 60 mL 2  . Insulin Pen Needle (1ST TIER UNIFINE PENTIPS) 31G X 6 MM MISC For use  when injecting insulin. Dx: E11.9 300 each 1  . Insulin Syringes, Disposable, U-100 0.5 ML MISC Use to inject 60 units of insulin at bedtime.  DX : E11.9 100 each prn  . metFORMIN (GLUCOPHAGE-XR) 500 MG 24 hr tablet TAKE 1 TABLET BY MOUTH  DAILY WITH BREAKFAST 90 tablet 3  . metoprolol succinate (TOPROL-XL) 100 MG 24 hr tablet TAKE 1 TABLET BY MOUTH  DAILY 90 tablet 3  . ONE TOUCH LANCETS MISC Check blood sugar 3 times daily. One touch delica lancet 66Y Dx: Q03.4 600 each 4  . atorvastatin (LIPITOR) 80 MG tablet Take 1 tablet (80 mg total) by mouth daily at 6 PM. 90 tablet 3  . glucose blood (ACCU-CHEK AVIVA PLUS) test strip For testing blood sugars 3 times daily. Dx:E11.9 300 each 4  . hydrochlorothiazide (MICROZIDE) 12.5 MG capsule Take 12.5 mg by mouth daily.     No facility-administered medications prior to visit.    Allergies  Allergen Reactions  . Ace Inhibitors   . Amoxicillin-Pot Clavulanate     REACTION: skin peeling  . Fluoxetine Other (See Comments)    sedation  . Propoxyphene Hcl     ROS Review of Systems    Objective:    Physical Exam Constitutional:      Appearance: She is well-developed.  HENT:     Head: Normocephalic and atraumatic.  Cardiovascular:     Rate and Rhythm: Normal rate and regular rhythm.     Heart sounds: Normal heart sounds.  Pulmonary:     Effort: Pulmonary effort is normal.     Breath sounds: Normal breath sounds.  Skin:    General: Skin is warm and dry.  Neurological:     Mental Status: She is alert and oriented to person, place, and time.  Psychiatric:        Behavior: Behavior normal.     BP (!) 141/58   Pulse 78   Ht '5\' 2"'$  (1.575 m)   Wt 176 lb (79.8 kg)   LMP 07/10/2011   SpO2 98%   BMI 32.19 kg/m  Wt Readings from Last 3 Encounters:  02/29/20 176 lb (79.8 kg)  12/07/19 180 lb (81.6 kg)  11/09/19 177 lb (80.3 kg)     There are no preventive care reminders to display for this patient.  There are no preventive care  reminders to display for this patient.  Lab Results  Component Value Date   TSH 2.120 12/07/2019   Lab Results  Component Value Date  WBC 8.7 12/07/2019   HGB 13.3 12/07/2019   HCT 38.5 12/07/2019   MCV 85 12/07/2019   PLT 304 12/07/2019   Lab Results  Component Value Date   NA 138 12/07/2019   K 3.8 12/07/2019   CO2 25 12/07/2019   GLUCOSE 305 (H) 12/07/2019   BUN 15 12/07/2019   CREATININE 0.84 12/07/2019   BILITOT 0.3 12/07/2019   ALKPHOS 95 12/07/2019   AST 14 12/07/2019   ALT 11 12/07/2019   PROT 6.9 12/07/2019   ALBUMIN 4.3 12/07/2019   CALCIUM 9.5 12/07/2019   ANIONGAP 14 11/04/2019   Lab Results  Component Value Date   CHOL 190 04/10/2019   Lab Results  Component Value Date   HDL 75 04/10/2019   Lab Results  Component Value Date   LDLCALC 73 04/10/2019   Lab Results  Component Value Date   TRIG 211 (H) 04/10/2019   Lab Results  Component Value Date   CHOLHDL 2.5 04/10/2019   Lab Results  Component Value Date   HGBA1C 8.1 (A) 02/29/2020      Assessment & Plan:   Problem List Items Addressed This Visit      Cardiovascular and Mediastinum   HYPERTENSION, BENIGN SYSTEMIC - Primary    Blood pressure is elevated today.  She reports she is taking her medications her amlodipine, metoprolol and hydrochlorothiazide.  Since she has started exercising again which I think is great we will just keep an eye on it for now.  But we could also consider increasing her hydrochlorothiazide 25 mg for better blood pressure control.      Relevant Medications   atorvastatin (LIPITOR) 80 MG tablet   hydrochlorothiazide (MICROZIDE) 12.5 MG capsule     Endocrine   Type 2 diabetes mellitus with complications (HCC)    Okay to decrease your Lantus to 34 units nightly.  Continue the exercise is absolutely fantastic.  Make sure you are not skipping meals.      Relevant Medications   glucose blood (CONTOUR TEST) test strip   Continuous Blood Gluc Sensor (DEXCOM G6  SENSOR) MISC   Continuous Blood Gluc Transmit (DEXCOM G6 TRANSMITTER) MISC   atorvastatin (LIPITOR) 80 MG tablet   Other Relevant Orders   POCT glycosylated hemoglobin (Hb A1C) (Completed)   POCT UA - Microalbumin (Completed)     Other   History of stroke with residual deficit   Relevant Medications   atorvastatin (LIPITOR) 80 MG tablet      Meds ordered this encounter  Medications  . glucose blood (CONTOUR TEST) test strip    Sig: Use as instructed    Dispense:  100 each    Refill:  3  . Continuous Blood Gluc Sensor (DEXCOM G6 SENSOR) MISC    Sig: Use as directed.    Dispense:  9 each    Refill:  3  . Continuous Blood Gluc Transmit (DEXCOM G6 TRANSMITTER) MISC    Sig: Use daily.    Dispense:  1 each    Refill:  PRN  . atorvastatin (LIPITOR) 80 MG tablet    Sig: Take 1 tablet (80 mg total) by mouth daily at 6 PM.    Dispense:  90 tablet    Refill:  3  . hydrochlorothiazide (MICROZIDE) 12.5 MG capsule    Sig: Take 1 capsule (12.5 mg total) by mouth daily.    Dispense:  90 capsule    Refill:  1    Follow-up: Return in about 3 months (around 05/31/2020) for  Diabetes follow-up.    Asenath Balash, MD 

## 2020-02-29 NOTE — Patient Instructions (Signed)
Okay to decrease your Lantus to 34 units nightly.  Continue the exercise is absolutely fantastic.  Make sure you are not skipping meals.

## 2020-03-02 MED ORDER — HYDROCHLOROTHIAZIDE 12.5 MG PO CAPS: 12.5000 mg | ORAL_CAPSULE | Freq: Every day | ORAL | 1 refills | Status: DC

## 2020-03-02 MED ORDER — ATORVASTATIN CALCIUM 80 MG PO TABS: 80.0000 mg | ORAL_TABLET | Freq: Every day | ORAL | 3 refills | Status: AC

## 2020-03-02 NOTE — Assessment & Plan Note (Signed)
Okay to decrease your Lantus to 34 units nightly.  Continue the exercise is absolutely fantastic.  Make sure you are not skipping meals. 

## 2020-03-02 NOTE — Assessment & Plan Note (Addendum)
Blood pressure is elevated today.  She reports she is taking her medications her amlodipine, metoprolol and hydrochlorothiazide.  Since she has started exercising again which I think is great we will just keep an eye on it for now.  But we could also consider increasing her hydrochlorothiazide 25 mg for better blood pressure control.

## 2020-03-08 ENCOUNTER — Other Ambulatory Visit: Payer: Self-pay

## 2020-03-08 DIAGNOSIS — E118 Type 2 diabetes mellitus with unspecified complications: Secondary | ICD-10-CM

## 2020-03-10 ENCOUNTER — Other Ambulatory Visit: Payer: Self-pay

## 2020-03-10 DIAGNOSIS — Z794 Long term (current) use of insulin: Secondary | ICD-10-CM

## 2020-03-10 DIAGNOSIS — E118 Type 2 diabetes mellitus with unspecified complications: Secondary | ICD-10-CM

## 2020-03-10 MED ORDER — 1ST TIER UNIFINE PENTIPS 31G X 6 MM MISC: 1 refills | Status: AC

## 2020-03-10 MED ORDER — ONETOUCH ULTRASOFT LANCETS MISC
12 refills | Status: AC
Start: 2020-03-10 — End: ?

## 2020-03-30 ENCOUNTER — Other Ambulatory Visit: Payer: Self-pay | Admitting: Family Medicine

## 2020-04-06 ENCOUNTER — Other Ambulatory Visit: Payer: Self-pay | Admitting: Family Medicine

## 2020-04-06 DIAGNOSIS — E118 Type 2 diabetes mellitus with unspecified complications: Secondary | ICD-10-CM

## 2020-04-07 ENCOUNTER — Ambulatory Visit (INDEPENDENT_AMBULATORY_CARE_PROVIDER_SITE_OTHER): Payer: Managed Care, Other (non HMO) | Admitting: Family Medicine

## 2020-04-07 ENCOUNTER — Encounter: Payer: Self-pay | Admitting: Family Medicine

## 2020-04-07 DIAGNOSIS — S0990XA Unspecified injury of head, initial encounter: Secondary | ICD-10-CM | POA: Diagnosis not present

## 2020-04-07 NOTE — Patient Instructions (Signed)
Very nice to see you today! I don't see any signs of concussion today.  I would use an ice pack to your scalp as needed.  Call if you develop any new or worsening symptoms.

## 2020-04-10 DIAGNOSIS — S0990XA Unspecified injury of head, initial encounter: Secondary | ICD-10-CM | POA: Insufficient documentation

## 2020-04-10 NOTE — Assessment & Plan Note (Signed)
Mild trauma from trunk lid.  She did have headache, improved today.  Mild ttp along scalp but no hematoma or significant bruising noted.  Neuro exam is reassuring Recommend icing to scalp Call if developing new or worsening symptoms.

## 2020-04-10 NOTE — Progress Notes (Signed)
Sabrina Dougherty - 59 y.o. female MRN [id number]  Date of birth: [birth year]  Subjective Chief Complaint  Patient presents with  . Headache  . Hand Injury  . Neck Pain    HPI Sabrina Dougherty is a 59 y.o. female is a 59 y.o. female with history of CVA with aphasia, HTN and T2DM here today with complaint of headache.  She states that she was trying to open her trunk yesterday and it accidentally hit her in the head when it popped open.  She reports that she had mild headache at first but this has improved.  She does have tender spot on her scalp.  She does also have some mild neck pain with movement.  She denies dizziness, nausea, vision changes, confusion or increased fatigue.    She also noticed a bruise next to her thumb a few days ago.  She is unsure where this came from.  She denies pain, swelling, or know injury. It appears to be improving.   ROS:  A comprehensive ROS was completed and negative except as noted per HPI   Allergies  Allergen Reactions  . Ace Inhibitors   . Amoxicillin-Pot Clavulanate     REACTION: skin peeling  . Fluoxetine Other (See Comments)    sedation  . Propoxyphene Hcl     Past Medical History:  Diagnosis Date  . Diabetes mellitus   . Pancreatitis   . Stroke [facility]) 1990s   left middle cerebral artery stroke  . Stroke [facility]) 02/2012   left temporal infarct    History reviewed. No pertinent surgical history.  Social History   Socioeconomic History  . Marital status: Married    Spouse name: Not on file  . Number of children: Not on file  . Years of education: Not on file  . Highest education level: Not on file  Occupational History  . Not on file  Tobacco Use  . Smoking status: Current Every Day Smoker    Packs/day: 0.10    Types: Cigarettes  . Smokeless tobacco: Never Used  Vaping Use  . Vaping Use: Never used  Substance and Sexual Activity  . Alcohol use: Yes    Comment: occ  . Drug use: No  . Sexual activity: Not on file   Other Topics Concern  . Not on file  Social History Narrative   Lives with her husband and daughter who is disabled. Exercises some.    Social Determinants of Health   Financial Resource Strain:   . Difficulty of Paying Living Expenses:   Food Insecurity:   . Worried About Programme researcher, broadcasting/film/video in the Last Year:   . Barista in the Last Year:   Transportation Needs:   . Freight forwarder (Medical):   Marland Kitchen Lack of Transportation (Non-Medical):   Physical Activity:   . Days of Exercise per Week:   . Minutes of Exercise per Session:   Stress:   . Feeling of Stress :   Social Connections:   . Frequency of Communication with Friends and Family:   . Frequency of Social Gatherings with Friends and Family:   . Attends Religious Services:   . Active Member of Clubs or Organizations:   . Attends Banker Meetings:   Marland Kitchen Marital Status:     Family History  Problem Relation Age of Onset  . Microcephaly Daughter        Seckel syndrome  . Alcohol abuse Mother   . Diabetes Sister  Health Maintenance  Topic Date Due  . INFLUENZA VACCINE  04/10/2020  . COLONOSCOPY  11/08/2020 (Originally 10/17/2009)  . HIV Screening  09/09/2029 (Originally 10/17/1974)  . MAMMOGRAM  08/12/2020  . OPHTHALMOLOGY EXAM  08/16/2020  . HEMOGLOBIN A1C  08/30/2020  . FOOT EXAM  02/28/2021  . URINE MICROALBUMIN  02/28/2021  . TETANUS/TDAP  03/20/2022  . PAP SMEAR-Modifier  07/12/2024  . PNEUMOCOCCAL POLYSACCHARIDE VACCINE AGE 37-64 HIGH RISK  Completed  . COVID-19 Vaccine  Completed  . Hepatitis C Screening  Completed     ----------------------------------------------------------------------------------------------------------------------------------------------------------------------------------------------------------------- Physical Exam BP (!) 155/66 (BP Location: Left Arm, Patient Position: Sitting, Cuff Size: Normal)   Pulse 71   Temp 97.6 F (36.4 C) (Temporal)   Ht 5' 1.81"  (1.57 m)   Wt 169 lb 4.8 oz (76.8 kg)   LMP 07/10/2011   SpO2 98%   BMI 31.16 kg/m   Physical Exam Constitutional:      Appearance: She is well-developed.  HENT:     Head: Normocephalic and atraumatic.  Eyes:     General: No scleral icterus. Cardiovascular:     Rate and Rhythm: Normal rate and regular rhythm.  Pulmonary:     Effort: Pulmonary effort is normal.     Breath sounds: Normal breath sounds.  Abdominal:     General: Abdomen is flat.  Musculoskeletal:     Cervical back: Neck supple.  Skin:    General: Skin is warm and dry.  Neurological:     General: No focal deficit present.     Mental Status: She is alert and oriented to person, place, and time.     Cranial Nerves: No cranial nerve deficit.     Motor: No weakness.     Gait: Gait normal.     Comments: Mild aphasia noted, chronic.   Psychiatric:        Mood and Affect: Mood normal.        Behavior: Behavior normal.     ------------------------------------------------------------------------------------------------------------------------------------------------------------------------------------------------------------------- Assessment and Plan  Head injury, acute Mild trauma from trunk lid.  She did have headache, improved today.  Mild ttp along scalp but no hematoma or significant bruising noted.  Neuro exam is reassuring Recommend icing to scalp Call if developing new or worsening symptoms.    No orders of the defined types were placed in this encounter.   No follow-ups on file.    This visit occurred during the SARS-CoV-2 public health emergency.  Safety protocols were in place, including screening questions prior to the visit, additional usage of staff PPE, and extensive cleaning of exam room while observing appropriate contact time as indicated for disinfecting solutions.

## 2020-04-11 ENCOUNTER — Other Ambulatory Visit: Payer: Self-pay

## 2020-04-11 DIAGNOSIS — I10 Essential (primary) hypertension: Secondary | ICD-10-CM

## 2020-04-11 MED ORDER — METOPROLOL SUCCINATE ER 100 MG PO TB24: 100.0000 mg | ORAL_TABLET | Freq: Every day | ORAL | 0 refills | Status: DC

## 2020-05-01 ENCOUNTER — Other Ambulatory Visit: Payer: Self-pay | Admitting: Physician Assistant

## 2020-05-05 ENCOUNTER — Other Ambulatory Visit: Payer: Self-pay | Admitting: Family Medicine

## 2020-05-05 DIAGNOSIS — E118 Type 2 diabetes mellitus with unspecified complications: Secondary | ICD-10-CM

## 2020-05-30 ENCOUNTER — Other Ambulatory Visit: Payer: Self-pay

## 2020-05-30 ENCOUNTER — Encounter: Payer: Self-pay | Admitting: Family Medicine

## 2020-05-30 ENCOUNTER — Ambulatory Visit (INDEPENDENT_AMBULATORY_CARE_PROVIDER_SITE_OTHER): Payer: Managed Care, Other (non HMO) | Admitting: Family Medicine

## 2020-05-30 VITALS — BP 135/55 | HR 71 | Ht 62.0 in | Wt 169.0 lb

## 2020-05-30 DIAGNOSIS — I10 Essential (primary) hypertension: Secondary | ICD-10-CM | POA: Diagnosis not present

## 2020-05-30 DIAGNOSIS — R519 Headache, unspecified: Secondary | ICD-10-CM | POA: Diagnosis not present

## 2020-05-30 DIAGNOSIS — E118 Type 2 diabetes mellitus with unspecified complications: Secondary | ICD-10-CM | POA: Diagnosis not present

## 2020-05-30 DIAGNOSIS — F329 Major depressive disorder, single episode, unspecified: Secondary | ICD-10-CM | POA: Diagnosis not present

## 2020-05-30 DIAGNOSIS — F32A Depression, unspecified: Secondary | ICD-10-CM

## 2020-05-30 LAB — POCT GLYCOSYLATED HEMOGLOBIN (HGB A1C): Hemoglobin A1C: 7.7 % — AB (ref 4.0–5.6)

## 2020-05-30 MED ORDER — OLMESARTAN-AMLODIPINE-HCTZ 40-5-25 MG PO TABS: ORAL_TABLET | ORAL | 1 refills | Status: DC

## 2020-05-30 MED ORDER — SERTRALINE HCL 50 MG PO TABS
50.0000 mg | ORAL_TABLET | Freq: Every day | ORAL | 3 refills | Status: AC
Start: 2020-05-30 — End: ?

## 2020-05-30 NOTE — Assessment & Plan Note (Signed)
Is like her blood pressure has not been well controlled ever since she was taken off of the Tribenzor.  Will discontinue amlodipine and hydrochlorothiazide and restart Tribenzor.  I did go ahead and decrease her metoprolol since we are adding an ARB just to make sure that we do not lower her blood pressure too much I am hopeful that this will actually help with her headaches that she has been having

## 2020-05-30 NOTE — Assessment & Plan Note (Signed)
A1C improved but not at goal.  He work on [employer] choices and regular exercise continue with medication regimen.

## 2020-05-30 NOTE — Assessment & Plan Note (Signed)
Uncontrolled for the last year now she feels like they are related to when her blood pressure is elevated certainly try to really work hard to get that under better control and switch her to [employer] but I did ask her to let me know sooner rather than later if the headaches persist after we get her back on her Tribenzor.  They do not seem consistent with migraine headaches.

## 2020-05-30 NOTE — Progress Notes (Signed)
Established Patient Office Visit  Subjective:  Patient ID: Maddy Graham, female    DOB: [birth year]  Age: 59 y.o. MRN: [id number]  CC:  Chief Complaint  Patient presents with  . Diabetes  . Hypertension      HPI Dunya Meiners presents for   Hypertension- Pt denies chest pain, SOB, dizziness, or heart palpitations.  Taking meds as directed w/o problems.  Denies medication side effects.  Reports she is been having a lot of headaches and feels like it is from her blood pressure she feels like it is been up a lot her home blood pressure machine has been reading a lot higher at home she says sometimes the headache is bilateral but today it is right-sided.  Is been on and off for about the last year ever since they took her off the [place].  She denies any light or sound sensitivity no nausea vomiting with the headaches.  Diabetes - no hypoglycemic events. No wounds or sores that are not healing well. No increased thirst or urination. Checking glucose at home. Taking medications as prescribed without any side effects.  He has not been exercising regularly because of the headaches.  Still doing speech therapy.  Her therapist went out on leave for pregnancy but she is working with another therapist right now.   Past Medical History:  Diagnosis Date  . Diabetes mellitus   . Pancreatitis   . Stroke [facility]) 1990s   left middle cerebral artery stroke  . Stroke ([place]) 02/2012   left temporal infarct    No past surgical history on file.  Family History  Problem Relation Age of Onset  . Microcephaly Daughter        Seckel syndrome  . Alcohol abuse Mother   . Diabetes Sister     Social History   Socioeconomic History  . Marital status: Married    Spouse name: Not on file  . Number of children: Not on file  . Years of education: Not on file  . Highest education level: Not on file  Occupational History  . Not on file  Tobacco Use  . Smoking status: Current  Every Day Smoker    Packs/day: 0.10    Types: Cigarettes  . Smokeless tobacco: Never Used  Vaping Use  . Vaping Use: Never used  Substance and Sexual Activity  . Alcohol use: Yes    Comment: occ  . Drug use: No  . Sexual activity: Not on file  Other Topics Concern  . Not on file  Social History Narrative   Lives with her husband and daughter who is disabled. Exercises some.    Social Determinants of Health   Financial Resource Strain:   . Difficulty of Paying Living Expenses: Not on file  Food Insecurity:   . Worried About Charity fundraiser in the Last Year: Not on file  . Ran Out of Food in the Last Year: Not on file  Transportation Needs:   . Lack of Transportation (Medical): Not on file  . Lack of Transportation (Non-Medical): Not on file  Physical Activity:   . Days of Exercise per Week: Not on file  . Minutes of Exercise per Session: Not on file  Stress:   . Feeling of Stress : Not on file  Social Connections:   . Frequency of Communication with Friends and Family: Not on file  . Frequency of Social Gatherings with Friends and Family: Not on file  . Attends Religious Services: Not  on file  . Active Member of Clubs or Organizations: Not on file  . Attends Archivist Meetings: Not on file  . Marital Status: Not on file  Intimate Partner Violence:   . Fear of Current or Ex-Partner: Not on file  . Emotionally Abused: Not on file  . Physically Abused: Not on file  . Sexually Abused: Not on file    Outpatient Medications Prior to Visit  Medication Sig Dispense Refill  . Alcohol Swabs (B-D SINGLE USE SWABS REGULAR) PADS USE TO CLEAN AREA TOPICALLY PRIOR TO INJECTIONS 400 each 1  . AMBULATORY NON FORMULARY MEDICATION Medication Name: One touch Verio Flex kit DX:E11.9 For testing 3 times a day 1 each 0  . aspirin 81 MG tablet Take 81 mg by mouth daily.    Marland Kitchen atorvastatin (LIPITOR) 80 MG tablet Take 1 tablet (80 mg total) by mouth daily at 6 PM. 90 tablet 3   . clopidogrel (PLAVIX) 75 MG tablet TAKE 1 TABLET BY MOUTH  DAILY 90 tablet 3  . Continuous Blood Gluc Sensor (DEXCOM G6 SENSOR) MISC Use as directed. 9 each 3  . Continuous Blood Gluc Transmit (DEXCOM G6 TRANSMITTER) MISC Use daily. 1 each PRN  . CONTOUR NEXT TEST test strip USE AS DIRECTED FOR TESTING BLOOD SUGAR 3 TIMES DAILY 300 strip 3  . insulin glargine (LANTUS) 100 UNIT/ML injection INJECT SUBCUTANEOUSLY 34  UNITS AT BEDTIME 60 mL 3  . Insulin Pen Needle (1ST TIER UNIFINE PENTIPS) 31G X 6 MM MISC For use when injecting insulin. Dx: E11.9 300 each 1  . Insulin Syringes, Disposable, U-100 0.5 ML MISC Use to inject 60 units of insulin at bedtime.  DX : E11.9 100 each prn  . Lancets (ONETOUCH ULTRASOFT) lancets Use as instructed 100 each 12  . metFORMIN (GLUCOPHAGE-XR) 500 MG 24 hr tablet TAKE 1 TABLET BY MOUTH  DAILY WITH BREAKFAST 90 tablet 3  . metoprolol succinate (TOPROL-XL) 100 MG 24 hr tablet Take 1 tablet (100 mg total) by mouth daily. Take with or immediately following a meal. Short supply until mail order sends prescription. 15 tablet 0  . amLODipine (NORVASC) 10 MG tablet TAKE 1 TABLET BY MOUTH  DAILY 90 tablet 3  . hydrochlorothiazide (MICROZIDE) 12.5 MG capsule Take 1 capsule (12.5 mg total) by mouth daily. 90 capsule 1   No facility-administered medications prior to visit.    Allergies  Allergen Reactions  . Ace Inhibitors   . Amoxicillin-Pot Clavulanate     REACTION: skin peeling  . Fluoxetine Other (See Comments)    sedation  . Propoxyphene Hcl     ROS Review of Systems    Objective:    Physical Exam Constitutional:      Appearance: She is well-developed.  HENT:     Head: Normocephalic and atraumatic.  Cardiovascular:     Rate and Rhythm: Normal rate and regular rhythm.     Heart sounds: Normal heart sounds.  Pulmonary:     Effort: Pulmonary effort is normal.     Breath sounds: Normal breath sounds.  Skin:    General: Skin is warm and dry.   Neurological:     Mental Status: She is alert and oriented to person, place, and time.  Psychiatric:        Behavior: Behavior normal.     BP (!) 135/55   Pulse 71   Ht _0  (1.575 m)   Wt 169 lb (76.7 kg)   LMP 07/10/2011   SpO2 97%  BMI 30.91 kg/m  Wt Readings from Last 3 Encounters:  05/30/20 169 lb (76.7 kg)  04/07/20 169 lb 4.8 oz (76.8 kg)  02/29/20 176 lb (79.8 kg)     Health Maintenance Due  Topic Date Due  . INFLUENZA VACCINE  04/10/2020    There are no preventive care reminders to display for this patient.  Lab Results  Component Value Date   TSH 2.120 12/07/2019   Lab Results  Component Value Date   WBC 8.7 12/07/2019   HGB 13.3 12/07/2019   HCT 38.5 12/07/2019   MCV 85 12/07/2019   PLT 304 12/07/2019   Lab Results  Component Value Date   NA 138 12/07/2019   K 3.8 12/07/2019   CO2 25 12/07/2019   GLUCOSE 305 (H) 12/07/2019   BUN 15 12/07/2019   CREATININE 0.84 12/07/2019   BILITOT 0.3 12/07/2019   ALKPHOS 95 12/07/2019   AST 14 12/07/2019   ALT 11 12/07/2019   PROT 6.9 12/07/2019   ALBUMIN 4.3 12/07/2019   CALCIUM 9.5 12/07/2019   ANIONGAP 14 11/04/2019   Lab Results  Component Value Date   CHOL 190 04/10/2019   Lab Results  Component Value Date   HDL 75 04/10/2019   Lab Results  Component Value Date   LDLCALC 73 04/10/2019   Lab Results  Component Value Date   TRIG 211 (H) 04/10/2019   Lab Results  Component Value Date   CHOLHDL 2.5 04/10/2019   Lab Results  Component Value Date   HGBA1C 7.7 (A) 05/30/2020      Assessment & Plan:   Problem List Items Addressed This Visit      Cardiovascular and Mediastinum   HYPERTENSION, BENIGN SYSTEMIC    Is like her blood pressure has not been well controlled ever since she was taken off of the [place].  Will discontinue amlodipine and hydrochlorothiazide and restart Tribenzor.  I did go ahead and decrease her metoprolol since we are adding an ARB just to make sure that we  do not lower her blood pressure too much I am hopeful that this will actually help with her headaches that she has been having      Relevant Medications   Olmesartan-amLODIPine-HCTZ (TRIBENZOR) 40-5-25 MG TABS     Endocrine   Type 2 diabetes mellitus with complications ([place]) - Primary    A1C improved but not at goal.  He work on [employer] choices and regular exercise continue with medication regimen.      Relevant Medications   Olmesartan-amLODIPine-HCTZ (TRIBENZOR) 40-5-25 MG TABS   Other Relevant Orders   POCT glycosylated hemoglobin (Hb A1C) (Completed)     Other   Frequent headaches    Uncontrolled for the last year now she feels like they are related to when her blood pressure is elevated certainly try to really work hard to get that under better control and switch her to [employer] but I did ask her to let me know sooner rather than later if the headaches persist after we get her back on her Tribenzor.  They do not seem consistent with migraine headaches.      Relevant Medications   sertraline (ZOLOFT) 50 MG tablet   Depression, acute    That she would like to restart her sertraline.  New prescription sent to pharmacy.  We had started it not long after her daughter had passed away.  She still feels like she is struggling some.      Relevant Medications   sertraline (  ZOLOFT) 50 MG tablet      Meds ordered this encounter  Medications  . Olmesartan-amLODIPine-HCTZ (TRIBENZOR) 40-5-25 MG TABS    Sig: Take 1 tablet by mouth  daily    Dispense:  90 tablet    Refill:  1  . sertraline (ZOLOFT) 50 MG tablet    Sig: Take 1 tablet (50 mg total) by mouth daily.    Dispense:  90 tablet    Refill:  3    Follow-up: Return in about 4 weeks (around 06/27/2020) for Nurse visit recheck BP.    Beatrice Lecher, MD

## 2020-05-30 NOTE — Assessment & Plan Note (Signed)
That she would like to restart her sertraline.  New prescription sent to pharmacy.  We had started it not long after her daughter had passed away.  She still feels like she is struggling some.

## 2020-05-30 NOTE — Patient Instructions (Signed)
Please stop your hydrochlorothiazide and amlodipine once you get the Tribenzor in.  You will take the Tribenzor in its place.  If you have plenty of the metoprolol left over than just take a half a tab daily.  If you run out or get low then let me know and I can send the 50 mg to your mail order pharmacy.  Try to get a fresh battery for your blood pressure machine as well.

## 2020-05-30 NOTE — Progress Notes (Signed)
Pt stated that she hasn't been walking as much lately. She reports headaches and BP has been elevated.

## 2020-06-13 ENCOUNTER — Other Ambulatory Visit: Payer: Self-pay | Admitting: Family Medicine

## 2020-07-01 ENCOUNTER — Ambulatory Visit: Payer: Managed Care, Other (non HMO)

## 2020-07-03 ENCOUNTER — Emergency Department (HOSPITAL_BASED_OUTPATIENT_CLINIC_OR_DEPARTMENT_OTHER)
Admission: EM | Admit: 2020-07-03 | Discharge: 2020-07-04 | Disposition: A | Discharge status: Home / Self Care | Payer: Managed Care, Other (non HMO) | Attending: Emergency Medicine | Admitting: Emergency Medicine

## 2020-07-03 ENCOUNTER — Encounter (HOSPITAL_BASED_OUTPATIENT_CLINIC_OR_DEPARTMENT_OTHER): Payer: Self-pay | Admitting: Emergency Medicine

## 2020-07-03 ENCOUNTER — Other Ambulatory Visit: Payer: Self-pay

## 2020-07-03 DIAGNOSIS — Z79899 Other long term (current) drug therapy: Secondary | ICD-10-CM | POA: Insufficient documentation

## 2020-07-03 DIAGNOSIS — Z8673 Personal history of transient ischemic attack (TIA), and cerebral infarction without residual deficits: Secondary | ICD-10-CM | POA: Diagnosis not present

## 2020-07-03 DIAGNOSIS — Z794 Long term (current) use of insulin: Secondary | ICD-10-CM | POA: Insufficient documentation

## 2020-07-03 DIAGNOSIS — F17210 Nicotine dependence, cigarettes, uncomplicated: Secondary | ICD-10-CM | POA: Insufficient documentation

## 2020-07-03 DIAGNOSIS — I10 Essential (primary) hypertension: Secondary | ICD-10-CM | POA: Insufficient documentation

## 2020-07-03 DIAGNOSIS — E119 Type 2 diabetes mellitus without complications: Secondary | ICD-10-CM | POA: Insufficient documentation

## 2020-07-03 DIAGNOSIS — R519 Headache, unspecified: Secondary | ICD-10-CM | POA: Diagnosis present

## 2020-07-03 DIAGNOSIS — Z7982 Long term (current) use of aspirin: Secondary | ICD-10-CM | POA: Insufficient documentation

## 2020-07-03 NOTE — ED Triage Notes (Signed)
Intermittent headache for 5 months that has been getting worse lately.

## 2020-07-04 ENCOUNTER — Emergency Department (HOSPITAL_BASED_OUTPATIENT_CLINIC_OR_DEPARTMENT_OTHER): Payer: Managed Care, Other (non HMO)

## 2020-07-04 MED ORDER — DIPHENHYDRAMINE HCL 50 MG/ML IJ SOLN
INTRAMUSCULAR | Status: AC
  Filled 2020-07-04: qty 1

## 2020-07-04 MED ORDER — PROCHLORPERAZINE EDISYLATE 10 MG/2ML IJ SOLN
10.0000 mg | Freq: Once | INTRAMUSCULAR | Status: AC
  Administered 2020-07-04: 10 mg via INTRAVENOUS

## 2020-07-04 MED ORDER — PROCHLORPERAZINE EDISYLATE 10 MG/2ML IJ SOLN
INTRAMUSCULAR | Status: AC
  Filled 2020-07-04: qty 2

## 2020-07-04 MED ORDER — DIPHENHYDRAMINE HCL 50 MG/ML IJ SOLN
12.5000 mg | Freq: Once | INTRAMUSCULAR | Status: AC
  Administered 2020-07-04: 12.5 mg via INTRAVENOUS

## 2020-07-04 NOTE — Discharge Instructions (Addendum)
You were seen today for headache.  Your CT scan is negative.  Follow-up with your primary doctor and neurologist given your ongoing nature of headache.

## 2020-07-04 NOTE — ED Provider Notes (Signed)
[facility] [facility] EMERGENCY DEPARTMENT Provider Note   CSN: [id number] Arrival date & time: 07/03/20  2340     History Chief Complaint  Patient presents with  . Headache    Latressa Harries is a 59 y.o. female.  HPI     This is a 59 year old female with a history of diabetes and stroke who presents with headache.  Patient reports she has had intermittent headaches over the last 5 months.  She states that they are frontal and nonradiating.  She states she has had a headache for the last week almost daily with worsening of her headache over the last 24 hours.  She has seen her primary physician who added a blood pressure medication; however, patient does not believe it is blood pressure related.  Patient has some residual speech and word finding difficulty from her prior stroke.  Otherwise she is not noted any new strokelike symptoms.  She denies any vision changes, nausea, vomiting.  Denies worst headache of her life.  Currently she rates her headache at 2 out of 10.  She has not taken anything for her headache.  She states it is slightly improved from when she arrived.  Past Medical History:  Diagnosis Date  . Diabetes mellitus   . Pancreatitis   . Stroke [facility]) 1990s   left middle cerebral artery stroke  . Stroke [facility]) 02/2012   left temporal infarct    Patient Active Problem List   Diagnosis Date Noted  . Frequent headaches 05/30/2020  . Head injury, acute 04/10/2020  . Aphasia S/P CVA 12/26/2015  . Depression, acute 12/26/2015  . Expressive aphasia 06/23/2015  . History of stroke with residual deficit 06/22/2015  . Type 2 diabetes mellitus with complications ([place]) 83/38/2505  . Dysarthria 06/22/2015  . DDD (degenerative disc disease), cervical 11/25/2013  . Smoker 02/28/2012  . ANXIETY STATE, UNSPECIFIED 07/27/2009  . PERIMENOPAUSAL STATUS 04/12/2008  . HYPERTENSION, BENIGN SYSTEMIC 06/18/2006    History reviewed. No pertinent surgical history.   OB  History   No obstetric history on file.     Family History  Problem Relation Age of Onset  . Microcephaly Daughter        Seckel syndrome  . Alcohol abuse Mother   . Diabetes Sister     Social History   Tobacco Use  . Smoking status: Current Every Day Smoker    Packs/day: 0.10    Types: Cigarettes  . Smokeless tobacco: Never Used  Vaping Use  . Vaping Use: Never used  Substance Use Topics  . Alcohol use: Yes    Comment: occ  . Drug use: No    Home Medications Prior to Admission medications   Medication Sig Start Date End Date Taking? Authorizing Provider  Alcohol Swabs (B-D SINGLE USE SWABS REGULAR) PADS USE TO CLEAN AREA TOPICALLY PRIOR TO INJECTIONS 05/02/20   Hali Marry, MD  AMBULATORY NON FORMULARY MEDICATION Medication Name: One touch Verio Flex kit DX:E11.9 For testing 3 times a day 02/04/18   Hali Marry, MD  aspirin 81 MG tablet Take 81 mg by mouth daily.    [provider]  atorvastatin (LIPITOR) 80 MG tablet Take 1 tablet (80 mg total) by mouth daily at 6 PM. 03/02/20   Hali Marry, MD  BD INSULIN SYRINGE U/F 31G X 5/16" 1 ML MISC USE TO INJECT LANTUS 60  UNITS AT BEDTIME 06/13/20   Hali Marry, MD  clopidogrel (PLAVIX) 75 MG tablet TAKE 1 TABLET BY  MOUTH  DAILY 03/30/20   Hali Marry, MD  Continuous Blood Gluc Sensor (DEXCOM G6 SENSOR) MISC Use as directed. 02/29/20   Hali Marry, MD  Continuous Blood Gluc Transmit (DEXCOM G6 TRANSMITTER) MISC Use daily. 02/29/20   Hali Marry, MD  CONTOUR NEXT TEST test strip USE AS DIRECTED FOR TESTING BLOOD SUGAR 3 TIMES DAILY 05/05/20   Hali Marry, MD  insulin glargine (LANTUS) 100 UNIT/ML injection INJECT SUBCUTANEOUSLY 34  UNITS AT BEDTIME 04/06/20   Hali Marry, MD  Insulin Pen Needle (1ST TIER UNIFINE PENTIPS) 31G X 6 MM MISC For use when injecting insulin. Dx: E11.9 03/10/20   Luetta Nutting, DO  Insulin Syringes, Disposable, U-100  0.5 ML MISC Use to inject 60 units of insulin at bedtime.  DX : E11.9 06/16/18   Hali Marry, MD  Lancets [facility] ULTRASOFT) lancets Use as instructed 03/10/20   Luetta Nutting, DO  metFORMIN (GLUCOPHAGE-XR) 500 MG 24 hr tablet TAKE 1 TABLET BY MOUTH  DAILY WITH BREAKFAST 01/29/20   Hali Marry, MD  metoprolol succinate (TOPROL-XL) 100 MG 24 hr tablet Take 1 tablet (100 mg total) by mouth daily. Take with or immediately following a meal. Short supply until mail order sends prescription. 04/11/20   Hali Marry, MD  Olmesartan-amLODIPine-HCTZ [facility]) 40-5-25 MG TABS Take 1 tablet by mouth  daily 05/30/20   Hali Marry, MD  sertraline (ZOLOFT) 50 MG tablet Take 1 tablet (50 mg total) by mouth daily. 05/30/20   Hali Marry, MD    Allergies    Ace inhibitors, Amoxicillin-pot clavulanate, Fluoxetine, and Propoxyphene hcl  Review of Systems   Review of Systems  Constitutional: Negative for fever.  Respiratory: Negative for shortness of breath.   Cardiovascular: Negative for chest pain.  Gastrointestinal: Negative for abdominal pain, nausea and vomiting.  Neurological: Positive for speech difficulty and headaches. Negative for dizziness, weakness and numbness.  All other systems reviewed and are negative.   Physical Exam Updated Vital Signs BP (!) 192/86 (BP Location: Left Arm)   Pulse 75   Temp 97.7 F (36.5 C) (Oral)   Resp 20   Wt 77.2 kg   LMP 07/10/2011   SpO2 100%   BMI 31.15 kg/m   Physical Exam Vitals and nursing note reviewed.  Constitutional:      Appearance: She is well-developed. She is not ill-appearing.  HENT:     Head: Normocephalic and atraumatic.     Mouth/Throat:     Mouth: Mucous membranes are moist.  Eyes:     Pupils: Pupils are equal, round, and reactive to light.  Cardiovascular:     Rate and Rhythm: Normal rate and regular rhythm.     Heart sounds: Normal heart sounds.  Pulmonary:     Effort: Pulmonary  effort is normal. No respiratory distress.     Breath sounds: No wheezing.  Abdominal:     Palpations: Abdomen is soft.  Musculoskeletal:     Cervical back: Neck supple.  Skin:    General: Skin is warm and dry.  Neurological:     Mental Status: She is alert and oriented to person, place, and time.     Comments: Cranial nerves II through XII intact, following 5 strength in all 4 extremities, no dysmetria to finger-nose-finger, occasional word finding difficulty noted with history taking  Psychiatric:        Mood and Affect: Mood normal.     ED Results / Procedures / Treatments  Labs (all labs ordered are listed, but only abnormal results are displayed) Labs Reviewed - No data to display  EKG None  Radiology CT Head Wo Contrast  Result Date: 07/04/2020 CLINICAL DATA:  Headache EXAM: CT HEAD WITHOUT CONTRAST TECHNIQUE: Contiguous axial images were obtained from the base of the skull through the vertex without intravenous contrast. COMPARISON:  None. FINDINGS: Brain: No evidence of acute territorial infarction, hemorrhage, hydrocephalus,extra-axial collection or mass lesion/mass effect. Large area of encephalomalacia involving the right temporoparietal lobe. Vascular: No hyperdense vessel or unexpected calcification. Skull: The skull is intact. No fracture or focal lesion identified. Sinuses/Orbits: The visualized paranasal sinuses and mastoid air cells are clear. The orbits and globes intact. Other: None IMPRESSION: Large area of encephalomalacia involving the left temporoparietal lobe likely from a prior MCA infarct. Electronically Signed   By: Prudencio Pair M.D.   On: 07/04/2020 01:17    Procedures Procedures (including critical care time)  Medications Ordered in ED Medications  diphenhydrAMINE (BENADRYL) 50 MG/ML injection (  Not Given 07/04/20 0055)  prochlorperazine (COMPAZINE) 10 MG/2ML injection (  Not Given 07/04/20 0054)  diphenhydrAMINE (BENADRYL) injection 12.5 mg (12.5  mg Intravenous Given 07/04/20 0049)  prochlorperazine (COMPAZINE) injection 10 mg (10 mg Intravenous Given 07/04/20 0051)    ED Course  I have reviewed the triage vital signs and the nursing notes.  Pertinent labs & imaging results that were available during my care of the patient were reviewed by me and considered in my medical decision making (see chart for details).    MDM Rules/Calculators/A&P                          Patient presents with headache.  She is overall nontoxic.  Vital signs notable initially for blood pressure 192/86.  On my evaluation blood pressure is 170s over 80s.  She has some residual word finding difficulty which is notable on exam but otherwise her neuro exam is stable.  Headache is quite mild at this time.  Low suspicion for intracranial hemorrhage although given history of stroke and residual deficits without known baseline, will obtain CT scan to evaluate for bleed.   Patient given Compazine and Benadryl.  She had complete resolution of her headache.  Blood pressure improved while she was in the emergency department.  Suspect the acute on chronic nature of her headache may be related to ongoing sequelae from her stroke.  Recommend follow-up with her primary physician and neurologist.  After history, exam, and medical workup I feel the patient has been appropriately medically screened and is safe for discharge home. Pertinent diagnoses were discussed with the patient. Patient was given return precautions.   Final Clinical Impression(s) / ED Diagnoses Final diagnoses:  Bad headache    Rx / DC Orders ED Discharge Orders    None       Isley Zinni, Barbette Hair, MD 07/04/20 0202

## 2020-09-08 ENCOUNTER — Other Ambulatory Visit: Payer: Self-pay | Admitting: Family Medicine

## 2020-09-08 DIAGNOSIS — Z1231 Encounter for screening mammogram for malignant neoplasm of breast: Secondary | ICD-10-CM

## 2020-09-23 ENCOUNTER — Ambulatory Visit: Payer: Managed Care, Other (non HMO) | Admitting: Family Medicine

## 2020-10-07 ENCOUNTER — Other Ambulatory Visit: Payer: Self-pay | Admitting: Family Medicine

## 2020-10-07 DIAGNOSIS — I10 Essential (primary) hypertension: Secondary | ICD-10-CM

## 2020-10-20 ENCOUNTER — Other Ambulatory Visit: Payer: Self-pay

## 2020-10-20 ENCOUNTER — Ambulatory Visit (INDEPENDENT_AMBULATORY_CARE_PROVIDER_SITE_OTHER): Payer: Managed Care, Other (non HMO)

## 2020-10-20 ENCOUNTER — Ambulatory Visit (INDEPENDENT_AMBULATORY_CARE_PROVIDER_SITE_OTHER): Payer: Managed Care, Other (non HMO) | Admitting: Family Medicine

## 2020-10-20 ENCOUNTER — Encounter: Payer: Self-pay | Admitting: Family Medicine

## 2020-10-20 VITALS — BP 157/59 | HR 74 | Ht 62.0 in | Wt 175.0 lb

## 2020-10-20 DIAGNOSIS — M25561 Pain in right knee: Secondary | ICD-10-CM

## 2020-10-20 DIAGNOSIS — T7411XA Adult physical abuse, confirmed, initial encounter: Secondary | ICD-10-CM

## 2020-10-20 DIAGNOSIS — Z13220 Encounter for screening for lipoid disorders: Secondary | ICD-10-CM

## 2020-10-20 DIAGNOSIS — E118 Type 2 diabetes mellitus with unspecified complications: Secondary | ICD-10-CM

## 2020-10-20 DIAGNOSIS — F4321 Adjustment disorder with depressed mood: Secondary | ICD-10-CM

## 2020-10-20 DIAGNOSIS — Z1231 Encounter for screening mammogram for malignant neoplasm of breast: Secondary | ICD-10-CM | POA: Diagnosis not present

## 2020-10-20 DIAGNOSIS — I10 Essential (primary) hypertension: Secondary | ICD-10-CM | POA: Diagnosis not present

## 2020-10-20 MED ORDER — LANTUS SOLOSTAR 100 UNIT/ML ~~LOC~~ SOPN: 44.0000 [IU] | PEN_INJECTOR | Freq: Every day | SUBCUTANEOUS | 3 refills | Status: DC
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
## 2020-10-20 NOTE — Assessment & Plan Note (Signed)
He is uncontrolled today.  Blood pressure looked great back in the fall.  Just make sure taking medications regularly.

## 2020-10-20 NOTE — Assessment & Plan Note (Signed)
Controlled.  Hemoglobin A1c of 8.1 today.Okay to increase insulin to 44 units daily.  If your sugars start coming down and fastings are dropping below 110 then okay to decrease back down to 40 units daily.  Continue to work on healthy diet and try to work on increasing activity level.   F/U in 3 mo

## 2020-10-20 NOTE — Patient Instructions (Signed)
Okay to increase insulin to 44 units daily.  If your sugars start coming down and fastings are dropping below 110 then okay to decrease back down to 40 units daily.

## 2020-10-26 ENCOUNTER — Telehealth: Payer: Self-pay | Admitting: *Deleted

## 2020-10-26 DIAGNOSIS — M25561 Pain in right knee: Secondary | ICD-10-CM

## 2020-10-28 LAB — CMP14+EGFR
ALT: 18 IU/L (ref 0–32)
AST: 19 IU/L (ref 0–40)
Albumin/Globulin Ratio: 1.7 (ref 1.2–2.2)
Albumin: 4.8 g/dL (ref 3.8–4.8)
Alkaline Phosphatase: 64 IU/L (ref 44–121)
BUN/Creatinine Ratio: 15 (ref 12–28)
BUN: 14 mg/dL (ref 8–27)
Bilirubin Total: 0.3 mg/dL (ref 0.0–1.2)
CO2: 24 mmol/L (ref 20–29)
Calcium: 10.6 mg/dL — ABNORMAL HIGH (ref 8.7–10.3)
Chloride: 97 mmol/L (ref 96–106)
Creatinine, Ser: 0.91 mg/dL (ref 0.57–1.00)
GFR calc Af Amer: 79 mL/min/{1.73_m2} (ref >59)
GFR calc non Af Amer: 68 mL/min/{1.73_m2} (ref >59)
Globulin, Total: 2.8 g/dL (ref 1.5–4.5)
Glucose: 82 mg/dL (ref 65–99)
Potassium: 3.8 mmol/L (ref 3.5–5.2)
Sodium: 139 mmol/L (ref 134–144)
Total Protein: 7.6 g/dL (ref 6.0–8.5)

## 2020-10-28 LAB — HEMOGLOBIN A1C
Est. average glucose Bld gHb Est-mCnc: 192 mg/dL
Hgb A1c MFr Bld: 8.3 % — ABNORMAL HIGH (ref 4.8–5.6)

## 2020-10-28 LAB — LIPID PANEL WITH LDL/HDL RATIO
Cholesterol, Total: 202 mg/dL — ABNORMAL HIGH (ref 100–199)
HDL: 81 mg/dL (ref >39)
LDL Chol Calc (NIH): 101 mg/dL — ABNORMAL HIGH (ref 0–99)
LDL/HDL Ratio: 1.2 ratio (ref 0.0–3.2)
Triglycerides: 117 mg/dL (ref 0–149)
VLDL Cholesterol Cal: 20 mg/dL (ref 5–40)

## 2020-11-11 ENCOUNTER — Telehealth: Payer: Self-pay

## 2020-11-11 NOTE — Telephone Encounter (Signed)
Sabrina Dougherty called asking about xray results. Xrays were done through [facility].  Elgie Collard, MD - 10/28/2020  Formatting of this note might be different from the original.  CLINICAL DATA: 60 year old female with right knee pain.   EXAM:  RIGHT KNEE - COMPLETE 4+ VIEW   COMPARISON: None.   FINDINGS:  There is no acute fracture or dislocation. Mild narrowing of the  medial compartment. No significant joint effusion. The soft tissues  are unremarkable.   IMPRESSION:  1. No acute fracture or dislocation.  2. Mild narrowing of the medial compartment.    Electronically Signed   By: Elgie Collard M.D.   On: 10/28/2020 21:49 Exam End: 10/27/20 9:49 AM   Specimen Collected: 10/28/20 9:48 PM Last Resulted: 10/28/20 9:49 PM  Received From: [facility]  Result Received: 10/31/20 10:52 AM

## 2020-11-14 NOTE — Telephone Encounter (Signed)
X-ray shows no fracture or dislocation there is some arthritis in the medial compartment of the knee.  If she still having pain then I would like to get her in with one of our sports med docs either at our [facility] location or here in our office.  Just let me know if she has a preference.

## 2020-11-14 NOTE — Telephone Encounter (Signed)
Left VM with results and callback number for scheduling appt with sports med.

## 2020-11-24 LAB — HM DIABETES EYE EXAM

## 2020-11-29 ENCOUNTER — Encounter: Payer: Self-pay | Admitting: Family Medicine

## 2020-12-01 ENCOUNTER — Other Ambulatory Visit: Payer: Self-pay

## 2020-12-01 MED ORDER — OLMESARTAN-AMLODIPINE-HCTZ 40-5-25 MG PO TABS: 1.0000 | ORAL_TABLET | Freq: Every day | ORAL | 3 refills | Status: DC

## 2021-01-25 ENCOUNTER — Other Ambulatory Visit: Payer: Self-pay

## 2021-01-25 DIAGNOSIS — Z1211 Encounter for screening for malignant neoplasm of colon: Secondary | ICD-10-CM

## 2021-01-25 NOTE — Progress Notes (Signed)
Pt called and stated that her cologuard kit expired before she did the test and she would like a new one. Order placed.

## 2021-01-25 NOTE — Progress Notes (Signed)
Please order as per protocol

## 2021-02-03 ENCOUNTER — Other Ambulatory Visit: Payer: Self-pay | Admitting: Family Medicine

## 2021-02-21 ENCOUNTER — Telehealth: Payer: Self-pay | Admitting: *Deleted

## 2021-02-21 DIAGNOSIS — E118 Type 2 diabetes mellitus with unspecified complications: Secondary | ICD-10-CM

## 2021-02-21 NOTE — Telephone Encounter (Signed)
Endocrinology referral placed to Dr.Ilona Goukassian per pt request.

## 2021-02-22 NOTE — Telephone Encounter (Signed)
Knee xray ordered.

## 2021-03-10 ENCOUNTER — Other Ambulatory Visit: Payer: Self-pay

## 2021-03-10 DIAGNOSIS — E118 Type 2 diabetes mellitus with unspecified complications: Secondary | ICD-10-CM

## 2021-03-10 MED ORDER — LANTUS SOLOSTAR 100 UNIT/ML ~~LOC~~ SOPN: 44.0000 [IU] | PEN_INJECTOR | Freq: Every day | SUBCUTANEOUS | 0 refills | Status: DC

## 2021-04-06 ENCOUNTER — Other Ambulatory Visit: Payer: Self-pay | Admitting: Family Medicine

## 2021-04-11 ENCOUNTER — Telehealth: Payer: Self-pay | Admitting: Family Medicine

## 2021-04-11 ENCOUNTER — Other Ambulatory Visit: Payer: Self-pay

## 2021-04-11 DIAGNOSIS — E118 Type 2 diabetes mellitus with unspecified complications: Secondary | ICD-10-CM

## 2021-04-11 MED ORDER — METFORMIN HCL ER 500 MG PO TB24: 500.0000 mg | ORAL_TABLET | Freq: Every day | ORAL | 0 refills | Status: DC

## 2021-04-11 NOTE — Telephone Encounter (Signed)
Please call pt to schedule appt.  No further refills until pt is seen.  T. Tashiana Lamarca, CMA  

## 2021-04-11 NOTE — Telephone Encounter (Signed)
Spouse called.  He has made an appointment for his wife for Sept 2nd and wants a refill on her Metformin until then.  Thank you.

## 2021-04-11 NOTE — Telephone Encounter (Signed)
LVM for patient to call back to get this appointment scheduled for follow up and refills. AM

## 2021-04-12 MED ORDER — METFORMIN HCL ER 500 MG PO TB24: 500.0000 mg | ORAL_TABLET | Freq: Every day | ORAL | 0 refills | Status: DC

## 2021-04-14 NOTE — Telephone Encounter (Signed)
Refill sent to pharmacy on 04/12/21.

## 2021-05-02 ENCOUNTER — Other Ambulatory Visit: Payer: Self-pay | Admitting: Family Medicine

## 2021-05-02 DIAGNOSIS — E118 Type 2 diabetes mellitus with unspecified complications: Secondary | ICD-10-CM

## 2021-05-08 ENCOUNTER — Other Ambulatory Visit: Payer: Self-pay

## 2021-05-08 DIAGNOSIS — E118 Type 2 diabetes mellitus with unspecified complications: Secondary | ICD-10-CM

## 2021-05-12 ENCOUNTER — Ambulatory Visit: Payer: Managed Care, Other (non HMO) | Admitting: Family Medicine

## 2021-05-16 ENCOUNTER — Other Ambulatory Visit: Payer: Self-pay | Admitting: Family Medicine

## 2021-05-16 DIAGNOSIS — E118 Type 2 diabetes mellitus with unspecified complications: Secondary | ICD-10-CM

## 2021-05-17 NOTE — Telephone Encounter (Signed)
Call pt and advise her that she is OVERDUE for f/u on DM,BP.

## 2021-05-17 NOTE — Telephone Encounter (Signed)
LVM for patient to call back to get appt scheduled. AM 

## 2021-05-19 NOTE — Telephone Encounter (Signed)
Left another voicemail for patient to call back to get this appointment scheduled. AM

## 2021-05-22 ENCOUNTER — Telehealth: Payer: Self-pay

## 2021-05-22 DIAGNOSIS — E118 Type 2 diabetes mellitus with unspecified complications: Secondary | ICD-10-CM

## 2021-05-22 NOTE — Telephone Encounter (Signed)
Lets go ahead and add a CMP as well it looks like she [facility] and a lot of the measures were off.

## 2021-05-22 NOTE — Telephone Encounter (Signed)
Sabrina Dougherty's husband called and states they went to [facility] but there were no labs ordered. I looked in the chart and it looks like she will need an HgbA1C. Does she need any other labs?

## 2021-05-23 NOTE — Telephone Encounter (Signed)
Labs ordered and husband advised.

## 2021-05-27 LAB — CMP14+EGFR
ALT: 18 IU/L (ref 0–32)
AST: 15 IU/L (ref 0–40)
Albumin/Globulin Ratio: 1.7 (ref 1.2–2.2)
Albumin: 4.2 g/dL (ref 3.8–4.8)
Alkaline Phosphatase: 70 IU/L (ref 44–121)
BUN/Creatinine Ratio: 15 (ref 12–28)
BUN: 13 mg/dL (ref 8–27)
Bilirubin Total: 0.3 mg/dL (ref 0.0–1.2)
CO2: 27 mmol/L (ref 20–29)
Calcium: 10.1 mg/dL (ref 8.7–10.3)
Chloride: 97 mmol/L (ref 96–106)
Creatinine, Ser: 0.84 mg/dL (ref 0.57–1.00)
Globulin, Total: 2.5 g/dL (ref 1.5–4.5)
Glucose: 159 mg/dL — ABNORMAL HIGH (ref 65–99)
Potassium: 4.1 mmol/L (ref 3.5–5.2)
Sodium: 140 mmol/L (ref 134–144)
Total Protein: 6.7 g/dL (ref 6.0–8.5)
eGFR: 79 mL/min/{1.73_m2} (ref >59)

## 2021-05-27 LAB — HEMOGLOBIN A1C
Est. average glucose Bld gHb Est-mCnc: 214 mg/dL
Hgb A1c MFr Bld: 9.1 % — ABNORMAL HIGH (ref 4.8–5.6)

## 2021-05-28 ENCOUNTER — Other Ambulatory Visit: Payer: Self-pay | Admitting: Family Medicine

## 2021-05-28 DIAGNOSIS — E118 Type 2 diabetes mellitus with unspecified complications: Secondary | ICD-10-CM

## 2021-05-29 NOTE — Progress Notes (Signed)
Hi Sabrina Dougherty, your A1c jumped up significantly from 8.3-9.1.  12 months ago it was down to 7.7 so it just seems to be going up each time.  How would you feel about adding a once a week injection to lower your blood sugar that is not insulin.  I would prefer to do something like that to better control your blood sugars so that we do not have to go up on your insulin.  And it is just once a week so it is more simple.  And it can help you lose weight.  Can also help curb appetite which can be helpful as well.  If you are okay with this then please let me know and we can get that started and then plan to follow-up in 3 months.

## 2021-05-30 ENCOUNTER — Other Ambulatory Visit: Payer: Self-pay | Admitting: Family Medicine

## 2021-05-30 DIAGNOSIS — E118 Type 2 diabetes mellitus with unspecified complications: Secondary | ICD-10-CM

## 2021-05-30 MED ORDER — TRULICITY 0.75 MG/0.5ML ~~LOC~~ SOAJ: 0.7500 mg | SUBCUTANEOUS | 1 refills | Status: AC

## 2021-05-30 NOTE — Progress Notes (Signed)
Prescription placed.  Please keep regular scheduled follow-up for diabetes.

## 2021-08-21 ENCOUNTER — Other Ambulatory Visit: Payer: Self-pay | Admitting: Family Medicine

## 2021-08-21 DIAGNOSIS — E118 Type 2 diabetes mellitus with unspecified complications: Secondary | ICD-10-CM

## 2021-10-29 ENCOUNTER — Other Ambulatory Visit: Payer: Self-pay | Admitting: Family Medicine

## 2021-10-29 DIAGNOSIS — I10 Essential (primary) hypertension: Secondary | ICD-10-CM

## 2021-11-03 ENCOUNTER — Other Ambulatory Visit: Payer: Self-pay | Admitting: Family Medicine

## 2021-11-03 DIAGNOSIS — Z1231 Encounter for screening mammogram for malignant neoplasm of breast: Secondary | ICD-10-CM

## 2021-11-09 ENCOUNTER — Other Ambulatory Visit: Payer: Self-pay

## 2021-11-09 ENCOUNTER — Ambulatory Visit (INDEPENDENT_AMBULATORY_CARE_PROVIDER_SITE_OTHER): Payer: Managed Care, Other (non HMO)

## 2021-11-09 DIAGNOSIS — Z1231 Encounter for screening mammogram for malignant neoplasm of breast: Secondary | ICD-10-CM

## 2021-11-09 NOTE — Progress Notes (Signed)
Please call patient. Normal mammogram.  Repeat in 1 year.  

## 2021-12-31 ENCOUNTER — Other Ambulatory Visit: Payer: Self-pay | Admitting: Family Medicine

## 2021-12-31 DIAGNOSIS — I10 Essential (primary) hypertension: Secondary | ICD-10-CM

## 2022-01-15 ENCOUNTER — Other Ambulatory Visit: Payer: Self-pay | Admitting: Family Medicine

## 2022-01-15 DIAGNOSIS — I10 Essential (primary) hypertension: Secondary | ICD-10-CM

## 2022-01-15 DIAGNOSIS — E118 Type 2 diabetes mellitus with unspecified complications: Secondary | ICD-10-CM

## 2022-01-28 ENCOUNTER — Other Ambulatory Visit: Payer: Self-pay | Admitting: Family Medicine

## 2022-03-31 ENCOUNTER — Other Ambulatory Visit: Payer: Self-pay | Admitting: Family Medicine

## 2022-05-01 ENCOUNTER — Other Ambulatory Visit: Payer: Self-pay | Admitting: Family Medicine

## 2022-05-04 ENCOUNTER — Other Ambulatory Visit: Payer: Self-pay | Admitting: Family Medicine

## 2022-05-04 DIAGNOSIS — I10 Essential (primary) hypertension: Secondary | ICD-10-CM

## 2022-05-10 ENCOUNTER — Other Ambulatory Visit: Payer: Self-pay | Admitting: Family Medicine

## 2022-09-17 IMAGING — MG MM DIGITAL SCREENING BILAT W/ TOMO AND CAD
8 series · 8 of 24 positions shown · non-contrast
Comparison: Previous exam(s).

CLINICAL DATA: Screening.

EXAM:
DIGITAL SCREENING BILATERAL MAMMOGRAM WITH TOMOSYNTHESIS AND CAD
TECHNIQUE: Bilateral screening digital craniocaudal and mediolateral oblique
mammograms were obtained. Bilateral screening digital breast
tomosynthesis was performed. The images were evaluated with
computer-aided detection.

[L MLO synth-2D]
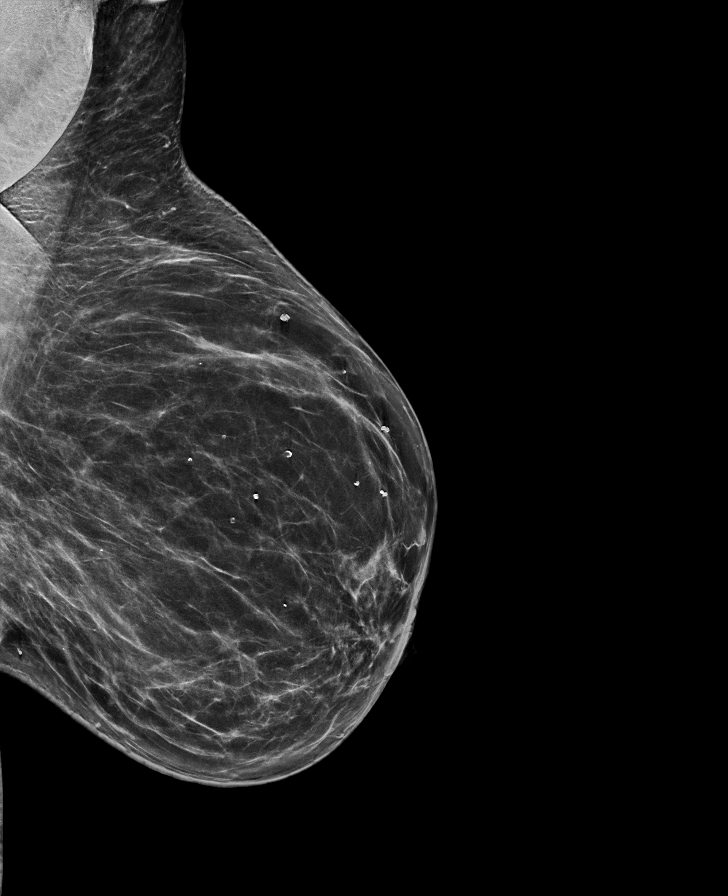

[R MLO synth-2D]
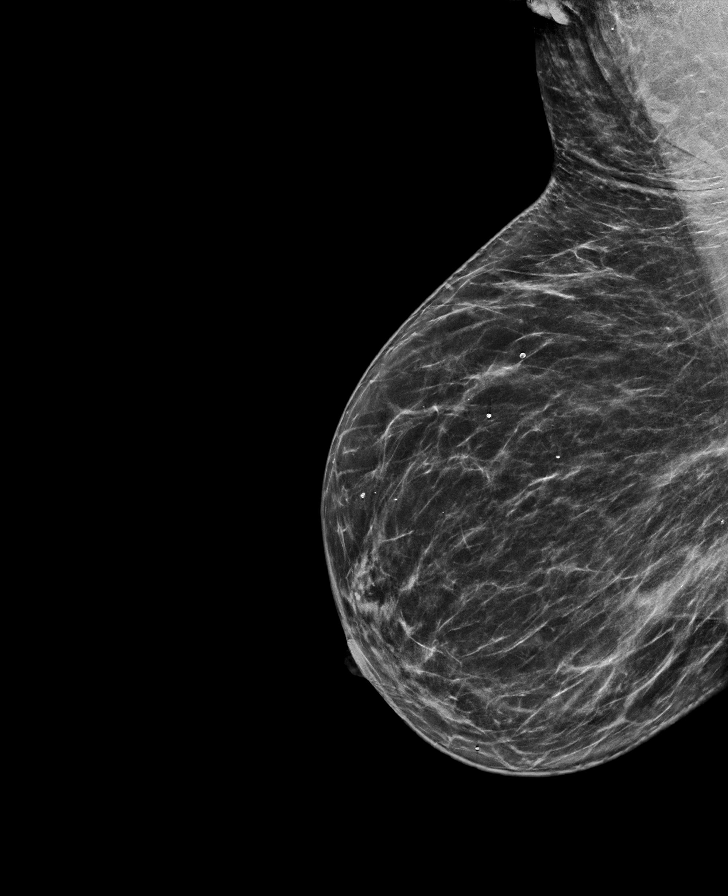

[R CC synth-2D]
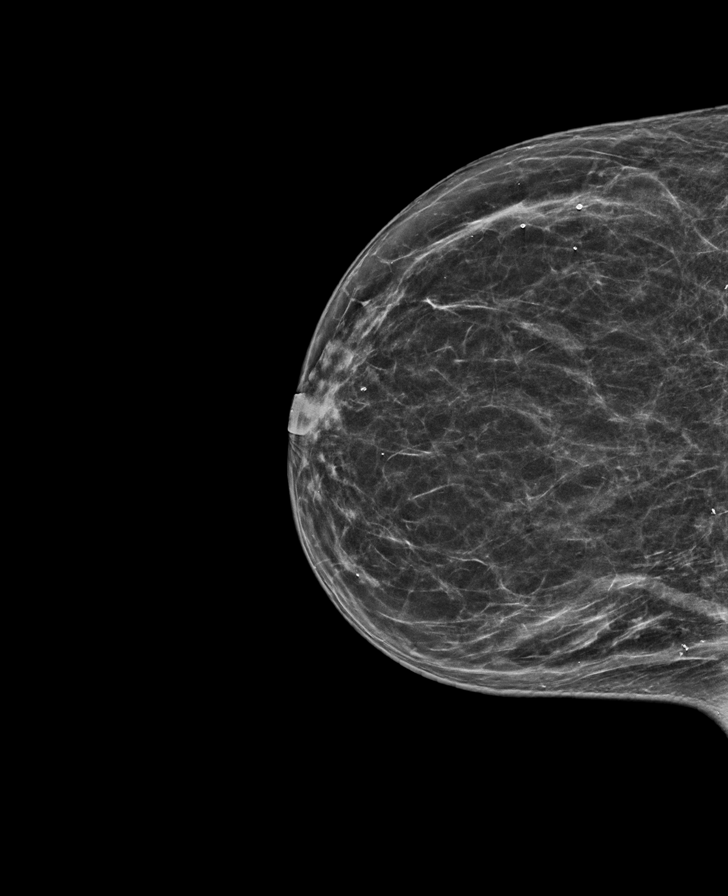

[L CC synth-2D]
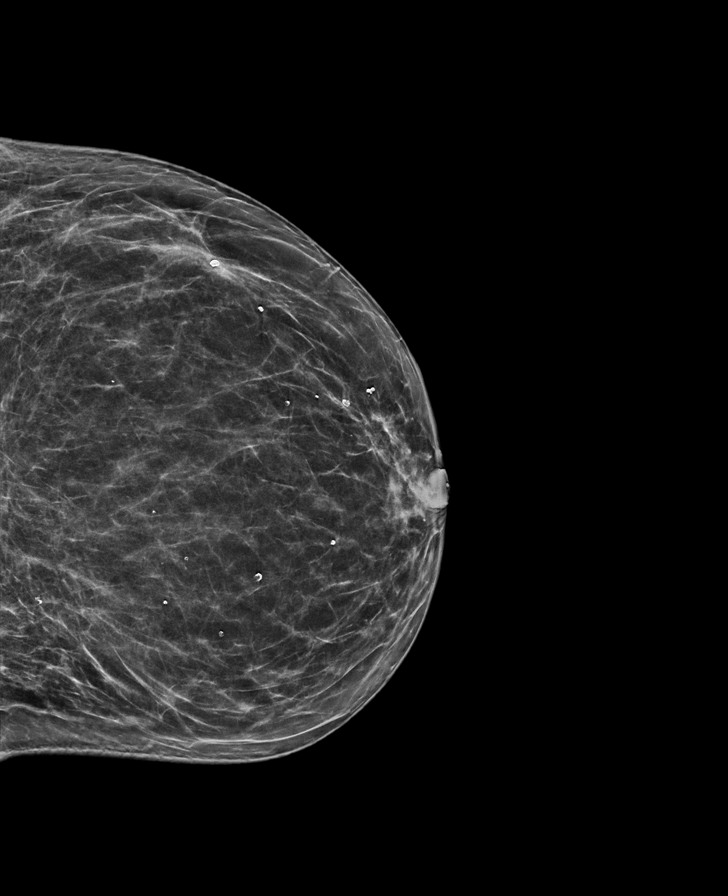

[L CC tomo · tomo slice 33/64.0]
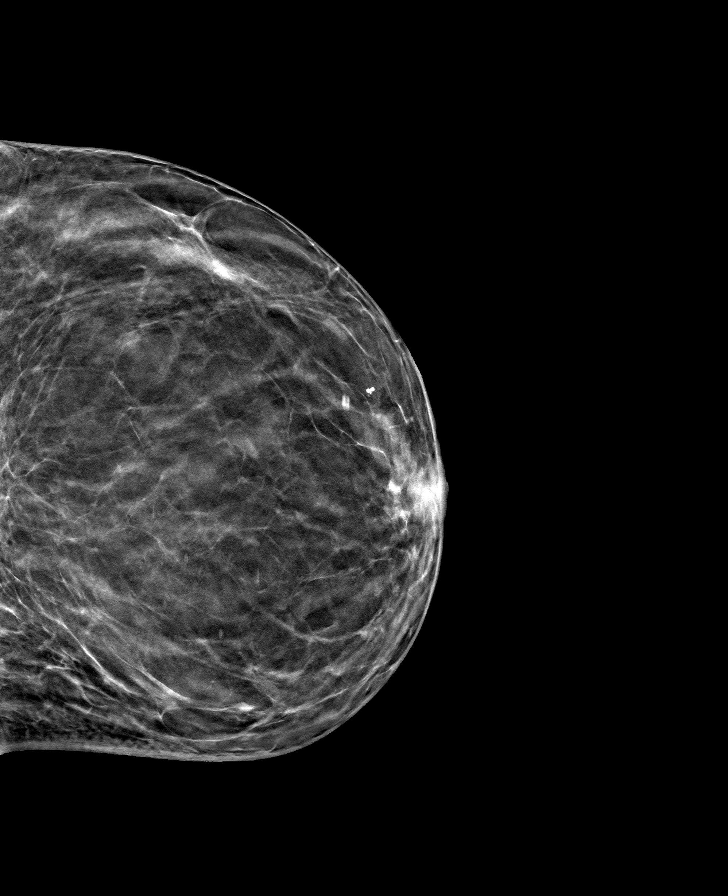

[R MLO tomo · tomo slice 35/70.0]
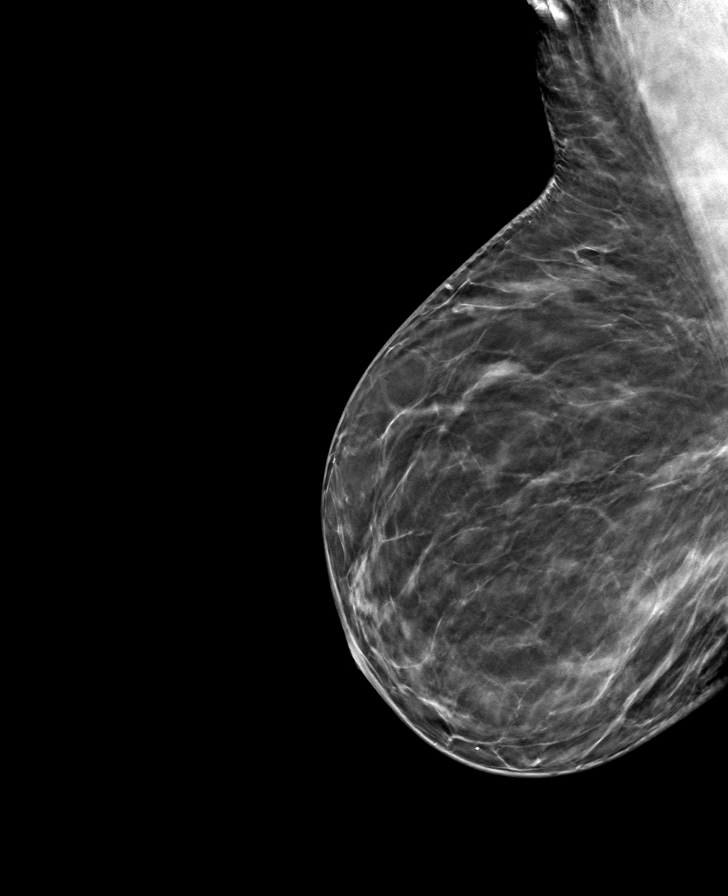

[R CC tomo · tomo slice 31/61.0]
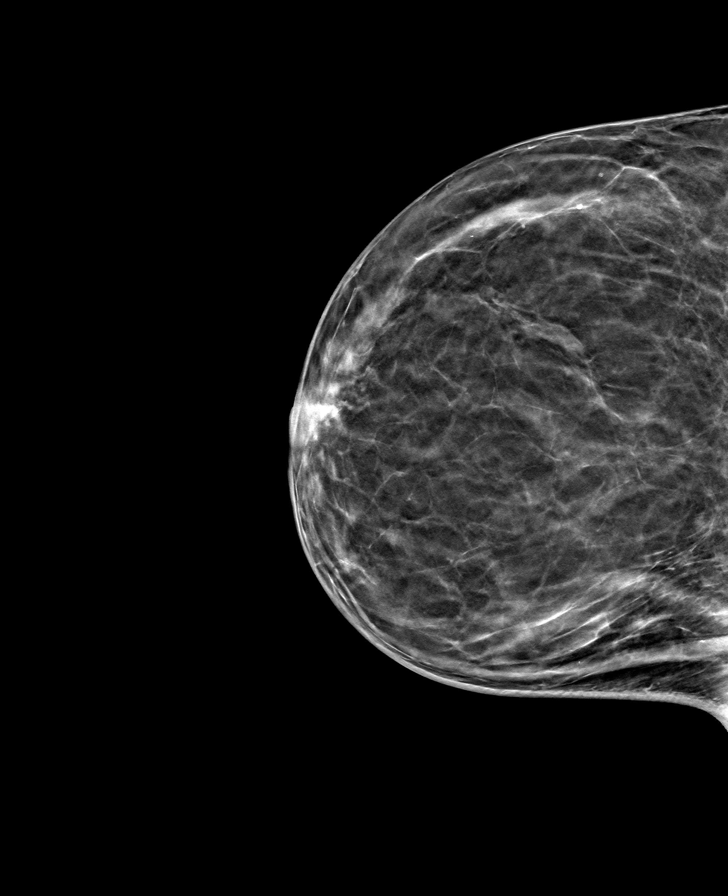

[L MLO tomo · tomo slice 39/76.0]
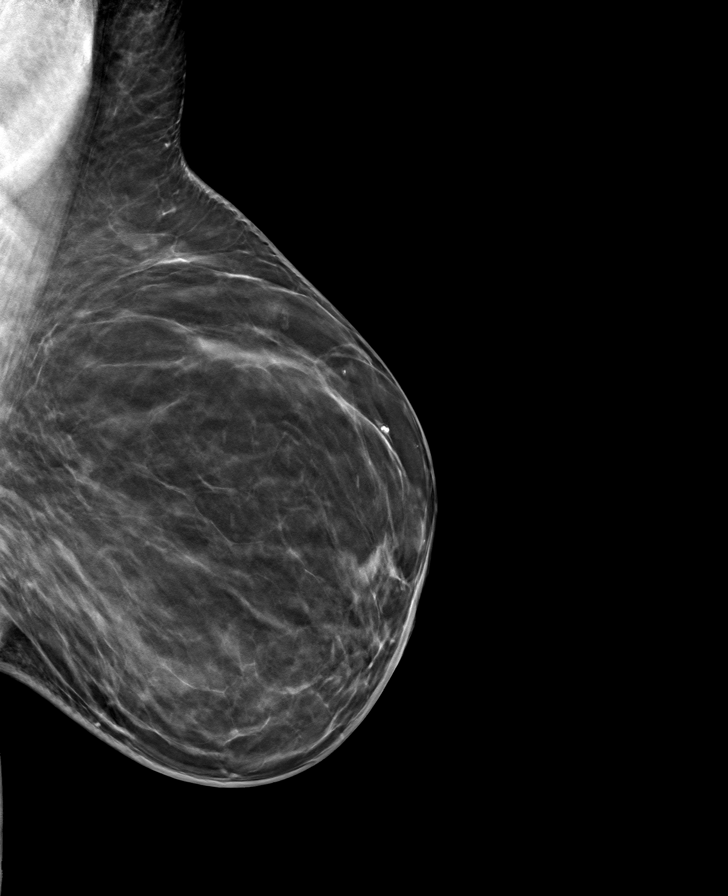

[8 of 24 positions shown; findings below may reference images not displayed]

ACR Breast Density Category b: There are scattered areas of
fibroglandular density.
FINDINGS: There are no findings suspicious for malignancy.
IMPRESSION: No mammographic evidence of malignancy. A result letter of this
screening mammogram will be mailed directly to the patient.

RECOMMENDATION:
Screening mammogram in one year. (Code:51-O-LD2)

BI-RADS CATEGORY  1: Negative.

## 2022-11-03 ENCOUNTER — Other Ambulatory Visit: Payer: Self-pay | Admitting: Family Medicine

## 2022-11-03 DIAGNOSIS — E118 Type 2 diabetes mellitus with unspecified complications: Secondary | ICD-10-CM

## 2023-03-18 ENCOUNTER — Other Ambulatory Visit: Payer: Self-pay | Admitting: Family Medicine

## 2023-03-18 DIAGNOSIS — E118 Type 2 diabetes mellitus with unspecified complications: Secondary | ICD-10-CM

## 2023-06-26 LAB — COLOGUARD: COLOGUARD: NEGATIVE

## 2024-03-17 ENCOUNTER — Telehealth: Payer: Self-pay

## 2024-03-17 NOTE — Telephone Encounter (Signed)
 Copied from CRM [id number]. Topic: General - Other >> Mar 16, 2024  4:40 PM Adrianna P wrote: Reason for CRM: Husband called he is trying to find who his wife had therapy with a few years ago, please call him back at [phone] or [phone]

## 2024-03-17 NOTE — Telephone Encounter (Signed)
 Left message for a return call.   She did see a speech therapist, Lauraine Molt Ward.
